# Patient Record
Sex: Female | Born: 1935 | Race: White | Hispanic: No | Marital: Married | Smoking: Never smoker
Health system: Southern US, Community
[De-identification: ages and names within clinical notes are randomized; demographics above are authoritative.]

## PROBLEM LIST (undated history)

## (undated) DIAGNOSIS — N952 Postmenopausal atrophic vaginitis: Secondary | ICD-10-CM

## (undated) DIAGNOSIS — Z972 Presence of dental prosthetic device (complete) (partial): Secondary | ICD-10-CM

## (undated) DIAGNOSIS — M810 Age-related osteoporosis without current pathological fracture: Secondary | ICD-10-CM

## (undated) DIAGNOSIS — N889 Noninflammatory disorder of cervix uteri, unspecified: Secondary | ICD-10-CM

## (undated) DIAGNOSIS — I499 Cardiac arrhythmia, unspecified: Secondary | ICD-10-CM

## (undated) DIAGNOSIS — E039 Hypothyroidism, unspecified: Secondary | ICD-10-CM

## (undated) HISTORY — DX: Cardiac arrhythmia, unspecified: I49.9

## (undated) HISTORY — PX: ANTERIOR AND POSTERIOR VAGINAL REPAIR: SUR5

## (undated) HISTORY — DX: Hypothyroidism, unspecified: E03.9

## (undated) HISTORY — PX: TONSILLECTOMY: SUR1361

## (undated) HISTORY — PX: TUBAL LIGATION: SHX77

## (undated) HISTORY — DX: Postmenopausal atrophic vaginitis: N95.2

## (undated) HISTORY — DX: Age-related osteoporosis without current pathological fracture: M81.0

## (undated) HISTORY — PX: ENTEROCELE REPAIR: SHX623

## (undated) HISTORY — PX: ABDOMINAL HYSTERECTOMY: SHX81

## (undated) HISTORY — DX: Noninflammatory disorder of cervix uteri, unspecified: N88.9

---

## 2004-11-08 ENCOUNTER — Ambulatory Visit: Payer: Self-pay | Admitting: Internal Medicine

## 2005-11-14 ENCOUNTER — Ambulatory Visit: Payer: Self-pay | Admitting: Internal Medicine

## 2006-11-30 ENCOUNTER — Ambulatory Visit: Payer: Self-pay | Admitting: Internal Medicine

## 2007-12-19 ENCOUNTER — Ambulatory Visit: Payer: Self-pay | Admitting: Internal Medicine

## 2008-12-24 ENCOUNTER — Ambulatory Visit: Payer: Self-pay | Admitting: Internal Medicine

## 2008-12-24 ENCOUNTER — Ambulatory Visit: Payer: Self-pay | Admitting: Unknown Physician Specialty

## 2009-01-01 ENCOUNTER — Inpatient Hospital Stay: Payer: Self-pay | Admitting: Unknown Physician Specialty

## 2009-08-03 ENCOUNTER — Ambulatory Visit: Payer: Self-pay | Admitting: Unknown Physician Specialty

## 2009-12-28 ENCOUNTER — Ambulatory Visit: Payer: Self-pay | Admitting: Internal Medicine

## 2010-01-01 ENCOUNTER — Ambulatory Visit: Payer: Self-pay | Admitting: Internal Medicine

## 2010-07-05 ENCOUNTER — Ambulatory Visit: Payer: Self-pay | Admitting: Internal Medicine

## 2011-01-10 ENCOUNTER — Ambulatory Visit: Payer: Self-pay

## 2012-01-30 ENCOUNTER — Ambulatory Visit: Payer: Self-pay

## 2013-03-20 ENCOUNTER — Ambulatory Visit: Payer: Self-pay

## 2014-03-24 ENCOUNTER — Ambulatory Visit: Payer: Self-pay

## 2014-12-11 ENCOUNTER — Ambulatory Visit: Payer: Self-pay | Admitting: Obstetrics and Gynecology

## 2014-12-11 LAB — BASIC METABOLIC PANEL
Anion Gap: 5 — ABNORMAL LOW (ref 7–16)
BUN: 11 mg/dL (ref 7–18)
CALCIUM: 9.6 mg/dL (ref 8.5–10.1)
CHLORIDE: 105 mmol/L (ref 98–107)
Co2: 32 mmol/L (ref 21–32)
Creatinine: 0.63 mg/dL (ref 0.60–1.30)
EGFR (African American): >60
EGFR (Non-African Amer.): >60
Glucose: 99 mg/dL (ref 65–99)
OSMOLALITY: 283 (ref 275–301)
Potassium: 4.3 mmol/L (ref 3.5–5.1)
Sodium: 142 mmol/L (ref 136–145)

## 2014-12-11 LAB — CBC
HCT: 39.6 % (ref 35.0–47.0)
HGB: 12.8 g/dL (ref 12.0–16.0)
MCH: 30.3 pg (ref 26.0–34.0)
MCHC: 32.4 g/dL (ref 32.0–36.0)
MCV: 94 fL (ref 80–100)
Platelet: 332 10*3/uL (ref 150–440)
RBC: 4.24 10*6/uL (ref 3.80–5.20)
RDW: 13.2 % (ref 11.5–14.5)
WBC: 7.5 10*3/uL (ref 3.6–11.0)

## 2014-12-15 ENCOUNTER — Ambulatory Visit: Payer: Self-pay | Admitting: Obstetrics and Gynecology

## 2014-12-16 LAB — CREATININE, SERUM
Creatinine: 0.68 mg/dL (ref 0.60–1.30)
EGFR (African American): >60

## 2014-12-16 LAB — HEMOGLOBIN: HGB: 11.4 g/dL — ABNORMAL LOW (ref 12.0–16.0)

## 2015-04-05 NOTE — Op Note (Signed)
PATIENT NAME:  Tiffany Bruce, Tiffany Bruce MR#:  [id number] DATE OF BIRTH:  1934/03/20  DATE OF PROCEDURE:  12/15/2014  PREOPERATIVE DIAGNOSIS: Procidentia.   POSTOPERATIVE DIAGNOSES: 1.  Vaginal enterocele.  2.  Third degree cystocele.  3.  High rectocele.   OPERATIVE PROCEDURES:  1.  Anterior and posterior colporrhaphy with enterocele ligation.  2.  Cystoscopy.   SURGEON: Prentice DockerMartin A. DeFrancesco, MD  FIRST ASSISTANT: Anika S. Valentino Saxonherry, MD  SECOND ASSISTANT: Mare LoanJan Beard, PA-S   ANESTHESIA: General endotracheal.   INDICATIONS: The patient is a 81 year old menopausal white female, with a known history of anterior and posterior colporrhaphy in the past with suspected persistence of uterus, presents now for surgical management of vaginal vault prolapse. Preoperative evaluation was suspicious for procidentia.   FINDINGS AT SURGERY: The apex of the vagina was dimpled with a macerated appearance of the cervix. No evidence of uterus, tubes or ovaries within the pelvic cavity. A large enterocele sac was excised. A high rectocele was noted. A third degree cystocele was noted.   DESCRIPTION OF PROCEDURE: The patient was brought to the Operating Room where she was placed in a supine position. General endotracheal anesthesia was induced without difficulty. She was placed in the dorsal lithotomy position, using the candy-cane stirrups. A Betadine perineal intravaginal prep and drape was performed in a standard fashion. A Foley catheter was placed into the bladder and was draining clear yellow urine. The cervix (suspected cervix) was grasped with a double-tooth tenaculum. The uterus appeared to be senescent. The posterior cuff was grasped and incised with Mayo scissors. With dissection of the rectovaginal space, there appeared to be no evidence of uterus present. Eventually the cul-de-sac was entered. Sterile milk was placed into the bladder to verify that the bladder was intact without evidence of perforation. The bladder  was noted to be intact. This sterile milk was then removed from the bladder.   The enterocele was isolated through sharp and blunt dissection. The anterior vagina was opened in the standard fashion for a cystocele repair. Sarina SerAllis Adair retractors were used to facilitate exposure. The vaginal mucosa was dissected off of the vesicovaginal fascia. The cystocele was mobilized. The cystocele was reduced using 2-0 Vicryl sutures in a vertical mattress technique. Part of the bladder muscularis was also reapproximated using several 3-0 Vicryl sutures. The cystocele was nicely reduced. The excess vaginal mucosa was trimmed. The anterior vagina was closed using simple interrupted technique of 2-0 Vicryl suture. The posterior defect was closed using a pursestring stitch of 2-0 Vicryl. The excess posterior vaginal mucosa was excised. The remaining vaginal mucosa was reapproximated using 2-0 Vicryl sutures.   Following completion of the anterior and posterior colporrhaphy with enterocele ligation, cystoscopy was performed. Fluorescein 10% solution, 1 mL was given intravenously. The 30-degree scope was used verify that there was adequate efflux from the ureteral orifices. Jets of urine were demonstrated bilaterally. The dome of the bladder was intact. Following cystoscopy, the Foley catheter was replaced. The vagina was packed with Kerlix gauze with Premarin cream.   The patient was then awakened, extubated, and taken to the recovery room in satisfactory condition.   Estimated blood  loss was 50 mL. All instruments, needles and sponge counts were verified as correct. The patient is to receive Ancef antibiotic prophylaxis.    ____________________________ Prentice DockerMartin A. DeFrancesco, MD mad:MT D: 12/15/2014 16:14:41 ET T: 12/15/2014 16:37:10 ET JOB#: [id number]  cc: Daphine DeutscherMartin A. DeFrancesco, MD, <Dictator> [facility] Prentice DockerMARTIN A DEFRANCESCO MD ELECTRONICALLY SIGNED 12/18/2014 13:02

## 2015-07-28 ENCOUNTER — Ambulatory Visit (INDEPENDENT_AMBULATORY_CARE_PROVIDER_SITE_OTHER): Payer: PRIVATE HEALTH INSURANCE | Admitting: Obstetrics and Gynecology

## 2015-07-28 ENCOUNTER — Encounter: Payer: Self-pay | Admitting: Obstetrics and Gynecology

## 2015-07-28 VITALS — BP 146/69 | HR 82 | Ht 62.0 in | Wt 112.1 lb

## 2015-07-28 DIAGNOSIS — N8110 Cystocele, unspecified: Secondary | ICD-10-CM

## 2015-07-28 DIAGNOSIS — Z780 Asymptomatic menopausal state: Secondary | ICD-10-CM | POA: Diagnosis not present

## 2015-07-28 DIAGNOSIS — N8111 Cystocele, midline: Secondary | ICD-10-CM | POA: Insufficient documentation

## 2015-07-28 DIAGNOSIS — N952 Postmenopausal atrophic vaginitis: Secondary | ICD-10-CM

## 2015-07-28 DIAGNOSIS — IMO0002 Reserved for concepts with insufficient information to code with codable children: Secondary | ICD-10-CM

## 2015-07-28 NOTE — Progress Notes (Signed)
Patient ID: Tiffany Bruce, female   DOB: [birth year], 81 y.o.   MRN: [id number] 6 month f/u ant/post repair w/enterocele ligaton Estrace cream once a week  Chief complaint: 1.  Six-month follow-up. 2.  Status post anterior/posterior colporrhaphy with enterocele ligation.  The patient presents today for follow-up 6 months after surgery for pelvic organ prolapse.  She reports normal bowel and bladder function.  She is not experiencing any pelvic pressure or discomfort.  The patient reports no vaginal discharge or bleeding.  She is using Estrace cream intravaginally once a week.  Past Medical History  Diagnosis Date  . Hypothyroidism   . Osteoporosis   . Abnormal heart rhythm   . Vaginal atrophy   . Vaginal atrophy   . Cervical lesion    Past Surgical History  Procedure Laterality Date  . Anterior and posterior vaginal repair  12/15/2014,2010  . Enterocele repair    . Tubal ligation    . Tonsillectomy       OBJECTIVE: BP 146/69 mmHg  Pulse 82  Ht  (1.575 m)  Wt 112 lb 1.6 oz (50.848 kg)  BMI 20.50 kg/m2 Patient is an elderly, pleasant female in no acute distress.  She is alert and oriented. Abdomen: Soft, nontender, without organomegaly. Pelvic exam: External genitalia-mild atrophic changes. BUS-small urethral caruncle, otherwise normal. Vagina-first-degree cystocele; no enterocele; no rectocele. Cervix-surgically absent Uterus-surgically absent Adnexa-nonpalpable, nontender. Rectovaginal-normal.  External exam; normal sphincter tone,; no rectal masses.  IMPRESSION: 1.  Normal 6 month interval follow-up after pelvic organ prolapse surgery. 2.  Status post anterior/posterior colporrhaphy with enterocele ligation.  PLAN: 1.  Continue with Estrace cream 1 g intravaginally weekly. 2.  Recommend no chronic heavy lifting. 3.  Return in 1 year for follow-up.  A total of 15 minutes were spent face-to-face with the patient during this encounter and over half of that time  dealt with counseling and coordination of care.   Herold Harms, MD

## 2015-07-28 NOTE — Patient Instructions (Signed)
1.  Continue with Estrace cream 1 g intravaginally weekly. 2.  Avoid chronic heavy lifting. 3.  Return in 1 year for follow-up.

## 2016-02-11 ENCOUNTER — Telehealth: Payer: Self-pay | Admitting: Obstetrics and Gynecology

## 2016-02-11 MED ORDER — ESTROGENS, CONJUGATED 0.625 MG/GM VA CREA: 1.0000 | TOPICAL_CREAM | Freq: Every day | VAGINAL | Status: DC

## 2016-02-11 NOTE — Telephone Encounter (Signed)
Pt aware med erx. 

## 2016-02-11 NOTE — Telephone Encounter (Signed)
Pt was given Premarin samples and would like rx sent to [facility] for a 90 day supply for ins purposes. For 90 day she pays $90.00 30 day is $45.00 that's why she wants a 90 days supply.

## 2016-02-19 DIAGNOSIS — H2513 Age-related nuclear cataract, bilateral: Secondary | ICD-10-CM | POA: Diagnosis not present

## 2016-03-11 DIAGNOSIS — E039 Hypothyroidism, unspecified: Secondary | ICD-10-CM | POA: Diagnosis not present

## 2016-03-11 DIAGNOSIS — I10 Essential (primary) hypertension: Secondary | ICD-10-CM | POA: Diagnosis not present

## 2016-03-11 DIAGNOSIS — E782 Mixed hyperlipidemia: Secondary | ICD-10-CM | POA: Diagnosis not present

## 2016-03-11 DIAGNOSIS — Z0001 Encounter for general adult medical examination with abnormal findings: Secondary | ICD-10-CM | POA: Diagnosis not present

## 2016-03-11 DIAGNOSIS — M545 Low back pain: Secondary | ICD-10-CM | POA: Diagnosis not present

## 2016-03-21 ENCOUNTER — Other Ambulatory Visit: Payer: Self-pay | Admitting: Nurse Practitioner

## 2016-03-21 DIAGNOSIS — Z0001 Encounter for general adult medical examination with abnormal findings: Secondary | ICD-10-CM | POA: Diagnosis not present

## 2016-03-21 DIAGNOSIS — E559 Vitamin D deficiency, unspecified: Secondary | ICD-10-CM | POA: Diagnosis not present

## 2016-03-21 DIAGNOSIS — I10 Essential (primary) hypertension: Secondary | ICD-10-CM | POA: Diagnosis not present

## 2016-03-21 DIAGNOSIS — E2839 Other primary ovarian failure: Secondary | ICD-10-CM

## 2016-03-21 DIAGNOSIS — Z1231 Encounter for screening mammogram for malignant neoplasm of breast: Secondary | ICD-10-CM

## 2016-03-21 DIAGNOSIS — E782 Mixed hyperlipidemia: Secondary | ICD-10-CM | POA: Diagnosis not present

## 2016-04-04 ENCOUNTER — Ambulatory Visit
Admission: RE | Admit: 2016-04-04 | Discharge: 2016-04-04 | Disposition: A | Discharge status: Home / Self Care | Payer: PPO | Source: Ambulatory Visit | Attending: Nurse Practitioner | Admitting: Nurse Practitioner

## 2016-04-04 ENCOUNTER — Ambulatory Visit: Payer: Self-pay

## 2016-04-04 DIAGNOSIS — Z13820 Encounter for screening for osteoporosis: Secondary | ICD-10-CM | POA: Diagnosis not present

## 2016-04-04 DIAGNOSIS — M8580 Other specified disorders of bone density and structure, unspecified site: Secondary | ICD-10-CM | POA: Diagnosis not present

## 2016-04-04 DIAGNOSIS — Z1231 Encounter for screening mammogram for malignant neoplasm of breast: Secondary | ICD-10-CM

## 2016-04-04 DIAGNOSIS — E2839 Other primary ovarian failure: Secondary | ICD-10-CM

## 2016-06-27 DIAGNOSIS — R21 Rash and other nonspecific skin eruption: Secondary | ICD-10-CM | POA: Diagnosis not present

## 2016-06-27 DIAGNOSIS — L237 Allergic contact dermatitis due to plants, except food: Secondary | ICD-10-CM | POA: Diagnosis not present

## 2016-07-19 ENCOUNTER — Telehealth: Payer: Self-pay | Admitting: Obstetrics and Gynecology

## 2016-07-19 NOTE — Telephone Encounter (Signed)
Patient called requesting a call back regarding her upcoming appointment. Thanks

## 2016-07-20 ENCOUNTER — Telehealth: Payer: Self-pay | Admitting: Obstetrics and Gynecology

## 2016-07-20 NOTE — Telephone Encounter (Signed)
Patient would like a call back on her cell to discuss something with you regarding her upcoming appointment. Thanks

## 2016-07-21 NOTE — Telephone Encounter (Signed)
FYI_-------Pt states IC is uncomfortable. Feels like something is stretching. Doesn't want to have sex anymore but husband wants to. Has an appt soon. Would like you to tell her husband at appt she shouldn't have sex anymore. She states she is [id number] and its time to give sex up!

## 2016-07-21 NOTE — Telephone Encounter (Signed)
See previous telephone message  

## 2016-08-02 ENCOUNTER — Ambulatory Visit (INDEPENDENT_AMBULATORY_CARE_PROVIDER_SITE_OTHER): Payer: PRIVATE HEALTH INSURANCE | Admitting: Obstetrics and Gynecology

## 2016-08-02 VITALS — BP 193/74 | HR 73 | Ht 62.0 in | Wt 113.9 lb

## 2016-08-02 DIAGNOSIS — N8110 Cystocele, unspecified: Secondary | ICD-10-CM

## 2016-08-02 DIAGNOSIS — Z780 Asymptomatic menopausal state: Secondary | ICD-10-CM | POA: Diagnosis not present

## 2016-08-02 DIAGNOSIS — N941 Unspecified dyspareunia: Secondary | ICD-10-CM

## 2016-08-02 DIAGNOSIS — IMO0002 Reserved for concepts with insufficient information to code with codable children: Secondary | ICD-10-CM

## 2016-08-02 DIAGNOSIS — N952 Postmenopausal atrophic vaginitis: Secondary | ICD-10-CM | POA: Diagnosis not present

## 2016-08-02 NOTE — Progress Notes (Signed)
Chief complaint: 1. Cystocele 2. Dyspareunia  Patient presents for 1 year follow-up on pelvic organ prolapse. She is status post anterior/posterior colporrhaphy with enterocele ligation. She has been having some increased discomfort with intercourse. She notes that her bladder appears to be dropping further since the surgical procedure. Husband has Alzheimer's dementia and is difficult to relate to regarding sexual relations. Patient tends decided to stop having intercourse due to discomfort and partners health condition  Past medical history, past surgical history, problem list, medications, and allergies are reviewed  OBJECTIVE: BP (!) 193/74   Pulse 73   Ht 5\' 2"  (1.575 m)   Wt 113 lb 14.4 oz (51.7 kg)   BMI 20.83 kg/m  Patient is an elderly, pleasant female in no acute distress.  She is alert and oriented. Abdomen: Soft, nontender, without organomegaly. Pelvic exam: External genitalia-mild atrophic changes. BUS-small urethral caruncle, otherwise normal. Vagina-second cystocele; no enterocele; no rectocele. Cervix-surgically absent Uterus-surgically absent Adnexa-nonpalpable, nontender. Rectovaginal-normal.  External exam; normal sphincter tone,; no rectal masses.  ASSESSMENT: 1. 18 months status post pelvic organ prolapse surgery 2. Status post anterior/posterior colporrhaphy with enterocele ligation 3. Second-degree cystocele (slight progression), asymptomatic 4. Dyspareunia  PLAN: 1. Continue with Estrace cream 1 g intravaginally weekly 2. Continue with no chronic heavy lifting 3. Return in 1 year for follow-up 4. Agree with the patient's decision to discontinue intercourse at this time.  A total of 15 minutes were spent face-to-face with the patient during this encounter and over half of that time dealt with counseling and coordination of care.  Herold HarmsMartin A Dinero Chavira, MD  Note: This dictation was prepared with Dragon dictation along with smaller phrase technology. Any  transcriptional errors that result from this process are unintentional.

## 2016-08-02 NOTE — Patient Instructions (Signed)
1. Continue using Estrace cream intravaginal once a week 2. Return in 1 year for follow-up 3. Recommend discontinuing sexual intercourse due to adverse effects

## 2016-09-19 DIAGNOSIS — E039 Hypothyroidism, unspecified: Secondary | ICD-10-CM | POA: Diagnosis not present

## 2016-09-19 DIAGNOSIS — N8110 Cystocele, unspecified: Secondary | ICD-10-CM | POA: Diagnosis not present

## 2017-03-10 DIAGNOSIS — H2513 Age-related nuclear cataract, bilateral: Secondary | ICD-10-CM | POA: Diagnosis not present

## 2017-03-27 ENCOUNTER — Other Ambulatory Visit: Payer: Self-pay | Admitting: Nurse Practitioner

## 2017-03-27 DIAGNOSIS — Z1231 Encounter for screening mammogram for malignant neoplasm of breast: Secondary | ICD-10-CM

## 2017-03-27 DIAGNOSIS — Z0001 Encounter for general adult medical examination with abnormal findings: Secondary | ICD-10-CM | POA: Diagnosis not present

## 2017-03-27 DIAGNOSIS — E039 Hypothyroidism, unspecified: Secondary | ICD-10-CM | POA: Diagnosis not present

## 2017-03-27 DIAGNOSIS — E782 Mixed hyperlipidemia: Secondary | ICD-10-CM | POA: Diagnosis not present

## 2017-03-31 DIAGNOSIS — E559 Vitamin D deficiency, unspecified: Secondary | ICD-10-CM | POA: Diagnosis not present

## 2017-03-31 DIAGNOSIS — E039 Hypothyroidism, unspecified: Secondary | ICD-10-CM | POA: Diagnosis not present

## 2017-03-31 DIAGNOSIS — Z0001 Encounter for general adult medical examination with abnormal findings: Secondary | ICD-10-CM | POA: Diagnosis not present

## 2017-04-17 ENCOUNTER — Ambulatory Visit
Admission: RE | Admit: 2017-04-17 | Discharge: 2017-04-17 | Disposition: A | Discharge status: Home / Self Care | Payer: PPO | Source: Ambulatory Visit | Attending: Nurse Practitioner | Admitting: Nurse Practitioner

## 2017-04-17 DIAGNOSIS — Z1231 Encounter for screening mammogram for malignant neoplasm of breast: Secondary | ICD-10-CM | POA: Diagnosis not present

## 2017-05-08 DIAGNOSIS — H6123 Impacted cerumen, bilateral: Secondary | ICD-10-CM | POA: Diagnosis not present

## 2017-05-08 DIAGNOSIS — J301 Allergic rhinitis due to pollen: Secondary | ICD-10-CM | POA: Diagnosis not present

## 2017-07-31 DIAGNOSIS — H2513 Age-related nuclear cataract, bilateral: Secondary | ICD-10-CM | POA: Diagnosis not present

## 2017-07-31 DIAGNOSIS — H01003 Unspecified blepharitis right eye, unspecified eyelid: Secondary | ICD-10-CM | POA: Diagnosis not present

## 2017-07-31 DIAGNOSIS — H04123 Dry eye syndrome of bilateral lacrimal glands: Secondary | ICD-10-CM | POA: Diagnosis not present

## 2017-08-01 ENCOUNTER — Ambulatory Visit (INDEPENDENT_AMBULATORY_CARE_PROVIDER_SITE_OTHER): Payer: PPO | Admitting: Obstetrics and Gynecology

## 2017-08-01 ENCOUNTER — Encounter: Payer: Self-pay | Admitting: Obstetrics and Gynecology

## 2017-08-01 VITALS — BP 160/63 | HR 82 | Ht 62.0 in | Wt 118.1 lb

## 2017-08-01 DIAGNOSIS — N941 Unspecified dyspareunia: Secondary | ICD-10-CM

## 2017-08-01 DIAGNOSIS — N952 Postmenopausal atrophic vaginitis: Secondary | ICD-10-CM | POA: Diagnosis not present

## 2017-08-01 NOTE — Progress Notes (Signed)
Dragon/transcription Corrupted (note documentation limited). BILLING IS BASED ON TIME  Chief complaint: 1. Cystocele 2. Dyspareunia  Patient presents for 1 year follow-up on menopausal symptoms of vaginal atrophy with dyspareunia. She also is status post pelvic organ prolapse surgery and has a recurrence of a third-degree cystocele. Bowel and bladder function Are normal. She is experiencing intercourse approximately once a week.  Past medical history, past surgical history, problem list, medications, and allergies are reviewed.  OBJECTIVE: BP (!) 160/63   Pulse 82   Ht 5\' 2"  (1.575 m)   Wt 118 lb 1.6 oz (53.6 kg)   BMI 21.60 kg/m  Patient is an elderly, pleasant female in no acute distress. She is alert and oriented. Abdomen: Soft, nontender, without organomegaly. Pelvic exam: (Unchanged) External genitalia-mild atrophic changes. BUS-small urethral caruncle, otherwise normal. Vagina-second cystocele; no enterocele; no rectocele. Cervix-surgically absent Uterus-surgically absent Adnexa-nonpalpable, nontender. Rectovaginal-normal. External exam; normal sphincter tone,; no rectal masses.  ASSESSMENT: 1.  Second-degree cystocele, stable. 2.  History of anterior, posterior colporrhaphy with enterocele ligation 2.5 years ago. 3.  Dyspareunia, tolerated 4.  Vaginal atrophy, stable.  PLAN: 1.  Continue with Estrace cream 1 g intravaginally weekly. 2.  Return in 1 year for follow-up.  A total of 15 minutes were spent face-to-face with the patient during this encounter and over half of that time dealt with counseling and coordination of care.   Herold Harms, MD

## 2017-08-01 NOTE — Patient Instructions (Signed)
1. Return in 1 year for follow-up 2. Continue with Estrace cream 1/2 g intravaginal once a week

## 2017-08-30 DIAGNOSIS — H04123 Dry eye syndrome of bilateral lacrimal glands: Secondary | ICD-10-CM | POA: Diagnosis not present

## 2017-08-30 DIAGNOSIS — H01003 Unspecified blepharitis right eye, unspecified eyelid: Secondary | ICD-10-CM | POA: Diagnosis not present

## 2017-09-25 DIAGNOSIS — E782 Mixed hyperlipidemia: Secondary | ICD-10-CM | POA: Diagnosis not present

## 2017-09-25 DIAGNOSIS — E039 Hypothyroidism, unspecified: Secondary | ICD-10-CM | POA: Diagnosis not present

## 2018-03-16 ENCOUNTER — Other Ambulatory Visit: Payer: Self-pay | Admitting: Nurse Practitioner

## 2018-03-16 DIAGNOSIS — Z1231 Encounter for screening mammogram for malignant neoplasm of breast: Secondary | ICD-10-CM

## 2018-04-06 ENCOUNTER — Other Ambulatory Visit: Payer: Self-pay | Admitting: Internal Medicine

## 2018-04-16 ENCOUNTER — Encounter: Payer: Self-pay | Admitting: Nurse Practitioner

## 2018-04-16 ENCOUNTER — Ambulatory Visit (INDEPENDENT_AMBULATORY_CARE_PROVIDER_SITE_OTHER): Payer: PPO | Admitting: Nurse Practitioner

## 2018-04-16 VITALS — BP 130/68 | HR 63 | Resp 16 | Ht 62.0 in | Wt 111.6 lb

## 2018-04-16 DIAGNOSIS — R300 Dysuria: Secondary | ICD-10-CM | POA: Diagnosis not present

## 2018-04-16 DIAGNOSIS — E039 Hypothyroidism, unspecified: Secondary | ICD-10-CM

## 2018-04-16 DIAGNOSIS — Z0001 Encounter for general adult medical examination with abnormal findings: Secondary | ICD-10-CM | POA: Diagnosis not present

## 2018-04-16 DIAGNOSIS — E559 Vitamin D deficiency, unspecified: Secondary | ICD-10-CM

## 2018-04-16 DIAGNOSIS — Z23 Encounter for immunization: Secondary | ICD-10-CM | POA: Insufficient documentation

## 2018-04-16 MED ORDER — LEVOTHYROXINE SODIUM 75 MCG PO TABS: 75.0000 ug | ORAL_TABLET | Freq: Every day | ORAL | 4 refills | Status: DC

## 2018-04-16 NOTE — Progress Notes (Signed)
[facility] [address] [place], [place] [address]  Internal MEDICINE  Office Visit Note  Patient Name: Tiffany Bruce  [id number]  [id number]  Date of Service: 04/16/2018   Pt is here for routine health maintenance  Chief Complaint  Patient presents with  . Hypothyroidism     The patient is due to have routine, fasting labs drawn. She is scheduled for mammogram on Wednesday, 04/18/2018. She has no physical concerns or complaints today.    examination  Current Medication: Outpatient Encounter Medications as of 04/16/2018  Medication Sig Note  . levothyroxine (SYNTHROID, LEVOTHROID) 75 MCG tablet Take 1 tablet (75 mcg total) by mouth daily before breakfast.   . [DISCONTINUED] levothyroxine (SYNTHROID, LEVOTHROID) 75 MCG tablet TAKE 1 TABLET BY MOUTH EVERY DAY ON AN EMPTY STOMACH FOR HYPOTHYROID   . Ascorbic Acid (VITAMIN C) 100 MG tablet Take 100 mg by mouth daily.   . calcium-vitamin D (OSCAL WITH D) 250-125 MG-UNIT per tablet Take 1 tablet by mouth daily.   Marland Kitchen conjugated estrogens (PREMARIN) vaginal cream Place 1 Applicatorful vaginally daily. 1 gram weekly   . docusate sodium (COLACE) 100 MG capsule Take 100 mg by mouth 2 (two) times daily.   . vitamin A 29562 UNIT capsule Take 10,000 Units by mouth daily.   . vitamin B-12 (CYANOCOBALAMIN) 100 MCG tablet Take 100 mcg by mouth daily.   . vitamin E 100 UNIT capsule Take by mouth daily.   . [DISCONTINUED] levothyroxine (SYNTHROID, LEVOTHROID) 50 MCG tablet TK 1 T PO QD ON EMPTY STOMACH FOR HYPOTHYROID 07/28/2015: Received from: External Pharmacy   No facility-administered encounter medications on file as of 04/16/2018.     Surgical History: Past Surgical History:  Procedure Laterality Date  . ABDOMINAL HYSTERECTOMY    . ANTERIOR AND POSTERIOR VAGINAL REPAIR  12/15/2014,2010  . ENTEROCELE REPAIR    . TONSILLECTOMY    . TUBAL LIGATION      Medical History: Past Medical History:  Diagnosis Date  . Abnormal  heart rhythm   . Cervical lesion   . Hypothyroidism   . Osteoporosis   . Vaginal atrophy   . Vaginal atrophy     Family History: Family History  Problem Relation Age of Onset  . Cancer Neg Hx   . Diabetes Neg Hx   . Heart disease Neg Hx       Review of Systems  Constitutional: Negative for activity change, chills, fatigue and unexpected weight change.  HENT: Positive for postnasal drip. Negative for congestion, rhinorrhea, sneezing and sore throat.   Eyes: Negative.  Negative for redness.  Respiratory: Negative for cough, chest tightness and shortness of breath.   Cardiovascular: Negative for chest pain and palpitations.  Gastrointestinal: Negative for abdominal pain, constipation, diarrhea, nausea and vomiting.  Endocrine:       Well controlled thyroid disease.   Genitourinary: Negative for dysuria and frequency.  Musculoskeletal: Negative for arthralgias, back pain, joint swelling and neck pain.  Skin: Negative for rash.  Neurological: Negative for dizziness, tremors, numbness and headaches.  Hematological: Negative for adenopathy. Does not bruise/bleed easily.  Psychiatric/Behavioral: Negative for behavioral problems (Depression), sleep disturbance and suicidal ideas. The patient is not nervous/anxious.      Vital Signs: BP 130/68   Pulse 63   Resp 16   Ht  (1.575 m)   Wt 111 lb 9.6 oz (50.6 kg)   SpO2 98%   BMI 20.41 kg/m    Physical Exam  Constitutional: She is oriented  to person, place, and time. She appears well-developed and well-nourished. No distress.  HENT:  Head: Normocephalic and atraumatic.  Nose: Nose normal.  Mouth/Throat: Oropharynx is clear and moist. No oropharyngeal exudate.  Eyes: Pupils are equal, round, and reactive to light. EOM are normal.  Neck: Normal range of motion. Neck supple. No JVD present. Carotid bruit is not present. No tracheal deviation present. No thyromegaly present.  Cardiovascular: Normal rate, regular rhythm,  normal heart sounds and intact distal pulses. Exam reveals no gallop and no friction rub.  No murmur heard. Pulmonary/Chest: Effort normal and breath sounds normal. No respiratory distress. She has no wheezes. She has no rales. She exhibits no tenderness. Right breast exhibits no inverted nipple, no mass, no nipple discharge, no skin change and no tenderness. Left breast exhibits no inverted nipple, no mass, no nipple discharge, no skin change and no tenderness.  Abdominal: Soft. Bowel sounds are normal. There is no tenderness.  Musculoskeletal: Normal range of motion.  Lymphadenopathy:    She has no cervical adenopathy.  Neurological: She is alert and oriented to person, place, and time. She displays normal reflexes. No cranial nerve deficit.  Skin: Skin is warm and dry. She is not diaphoretic.  Psychiatric: She has a normal mood and affect. Her behavior is normal. Judgment and thought content normal.  Nursing note and vitals reviewed.   Assessment/Plan: 1. Encounter for general adult medical examination with abnormal findings Annual health maintenance exam. Routine, fasting labs ordered.  - CBC with Differential/Platelet - Comprehensive metabolic panel  2. Acquired hypothyroidism Check thyroid panel and adjust levothyroxine as indicated.  - TSH - T4, free - Lipid panel - levothyroxine (SYNTHROID, LEVOTHROID) 75 MCG tablet; Take 1 tablet (75 mcg total) by mouth daily before breakfast.  Dispense: 90 tablet; Refill: 4  3. Vitamin D deficiency - Vitamin D 1,25 dihydroxy  4. Dysuria - Urinalysis, Routine w reflex microscopic  5. Need for vaccination against Streptococcus pneumoniae using pneumococcal conjugate vaccine 13 - Pneumococcal conjugate vaccine 13-valent IM   General Counseling: Keyanah verbalizes understanding of the findings of todays visit and agrees with plan of treatment. I have discussed any further diagnostic evaluation that may be needed or ordered today. We also  reviewed her medications today. she has been encouraged to call the office with any questions or concerns that should arise related to todays visit.  This patient was seen by Vincent Gros, FNP- C in Collaboration with Dr Lyndon Code as a part of collaborative care agreement    Orders Placed This Encounter  Procedures  . Pneumococcal conjugate vaccine 13-valent IM  . Urinalysis, Routine w reflex microscopic  . CBC with Differential/Platelet  . Comprehensive metabolic panel  . T4, free  . TSH  . Lipid panel  . Vitamin D 1,25 dihydroxy    Meds ordered this encounter  Medications  . levothyroxine (SYNTHROID, LEVOTHROID) 75 MCG tablet    Sig: Take 1 tablet (75 mcg total) by mouth daily before breakfast.    Dispense:  90 tablet    Refill:  4    Order Specific Question:   Supervising Provider    Answer:   Lyndon Code [[id number]]    Time spent: 69 Minutes      Lyndon Code, MD  Internal Medicine

## 2018-04-17 LAB — URINALYSIS, ROUTINE W REFLEX MICROSCOPIC
Bilirubin, UA: NEGATIVE
GLUCOSE, UA: NEGATIVE
KETONES UA: NEGATIVE
Nitrite, UA: NEGATIVE
Protein, UA: NEGATIVE
RBC UA: NEGATIVE
SPEC GRAV UA: 1.005 (ref 1.005–1.030)
UUROB: 0.2 mg/dL (ref 0.2–1.0)
pH, UA: 5 (ref 5.0–7.5)

## 2018-04-17 LAB — MICROSCOPIC EXAMINATION
Bacteria, UA: NONE SEEN
CASTS: NONE SEEN /LPF
EPITHELIAL CELLS (NON RENAL): NONE SEEN /HPF (ref 0–10)
RBC MICROSCOPIC, UA: NONE SEEN /HPF (ref 0–2)

## 2018-04-18 ENCOUNTER — Ambulatory Visit
Admission: RE | Admit: 2018-04-18 | Discharge: 2018-04-18 | Disposition: A | Discharge status: Home / Self Care | Payer: PPO | Source: Ambulatory Visit | Attending: Nurse Practitioner | Admitting: Nurse Practitioner

## 2018-04-18 DIAGNOSIS — Z1231 Encounter for screening mammogram for malignant neoplasm of breast: Secondary | ICD-10-CM | POA: Insufficient documentation

## 2018-04-20 DIAGNOSIS — Z23 Encounter for immunization: Secondary | ICD-10-CM | POA: Diagnosis not present

## 2018-04-20 DIAGNOSIS — E559 Vitamin D deficiency, unspecified: Secondary | ICD-10-CM | POA: Diagnosis not present

## 2018-04-20 DIAGNOSIS — Z0001 Encounter for general adult medical examination with abnormal findings: Secondary | ICD-10-CM | POA: Diagnosis not present

## 2018-04-20 DIAGNOSIS — E039 Hypothyroidism, unspecified: Secondary | ICD-10-CM | POA: Diagnosis not present

## 2018-04-25 LAB — COMPREHENSIVE METABOLIC PANEL
ALK PHOS: 85 IU/L (ref 39–117)
ALT: 19 IU/L (ref 0–32)
AST: 24 IU/L (ref 0–40)
Albumin/Globulin Ratio: 2.2 (ref 1.2–2.2)
Albumin: 3.9 g/dL (ref 3.5–4.7)
BUN/Creatinine Ratio: 18 (ref 12–28)
BUN: 11 mg/dL (ref 8–27)
Bilirubin Total: 0.4 mg/dL (ref 0.0–1.2)
CO2: 27 mmol/L (ref 20–29)
CREATININE: 0.62 mg/dL (ref 0.57–1.00)
Calcium: 9.2 mg/dL (ref 8.7–10.3)
Chloride: 106 mmol/L (ref 96–106)
GFR calc Af Amer: 98 mL/min/{1.73_m2} (ref >59)
GFR calc non Af Amer: 85 mL/min/{1.73_m2} (ref >59)
GLUCOSE: 105 mg/dL — AB (ref 65–99)
Globulin, Total: 1.8 g/dL (ref 1.5–4.5)
Potassium: 4.2 mmol/L (ref 3.5–5.2)
Sodium: 145 mmol/L — ABNORMAL HIGH (ref 134–144)
Total Protein: 5.7 g/dL — ABNORMAL LOW (ref 6.0–8.5)

## 2018-04-25 LAB — CBC WITH DIFFERENTIAL/PLATELET
BASOS ABS: 0 10*3/uL (ref 0.0–0.2)
Basos: 1 %
EOS (ABSOLUTE): 0.4 10*3/uL (ref 0.0–0.4)
EOS: 6 %
HEMATOCRIT: 37.6 % (ref 34.0–46.6)
HEMOGLOBIN: 12.7 g/dL (ref 11.1–15.9)
IMMATURE GRANS (ABS): 0 10*3/uL (ref 0.0–0.1)
Immature Granulocytes: 0 %
LYMPHS: 43 %
Lymphocytes Absolute: 2.6 10*3/uL (ref 0.7–3.1)
MCH: 29.7 pg (ref 26.6–33.0)
MCHC: 33.8 g/dL (ref 31.5–35.7)
MCV: 88 fL (ref 79–97)
MONOCYTES: 9 %
Monocytes Absolute: 0.5 10*3/uL (ref 0.1–0.9)
Neutrophils Absolute: 2.4 10*3/uL (ref 1.4–7.0)
Neutrophils: 41 %
Platelets: 303 10*3/uL (ref 150–379)
RBC: 4.28 x10E6/uL (ref 3.77–5.28)
RDW: 13.8 % (ref 12.3–15.4)
WBC: 5.9 10*3/uL (ref 3.4–10.8)

## 2018-04-25 LAB — T4, FREE: FREE T4: 1.35 ng/dL (ref 0.82–1.77)

## 2018-04-25 LAB — LIPID PANEL
CHOL/HDL RATIO: 4.2 ratio (ref 0.0–4.4)
Cholesterol, Total: 222 mg/dL — ABNORMAL HIGH (ref 100–199)
HDL: 53 mg/dL (ref >39)
LDL Calculated: 151 mg/dL — ABNORMAL HIGH (ref 0–99)
Triglycerides: 89 mg/dL (ref 0–149)
VLDL CHOLESTEROL CAL: 18 mg/dL (ref 5–40)

## 2018-04-25 LAB — VITAMIN D 1,25 DIHYDROXY
Vitamin D 1, 25 (OH)2 Total: 42 pg/mL
Vitamin D2 1, 25 (OH)2: <10 pg/mL
Vitamin D3 1, 25 (OH)2: 40 pg/mL

## 2018-04-25 LAB — TSH: TSH: 0.049 u[IU]/mL — ABNORMAL LOW (ref 0.450–4.500)

## 2018-06-07 IMAGING — MG MM DIGITAL SCREENING BILAT W/ TOMO W/ CAD
8 series · 9 of 24 positions shown · non-contrast
Comparison: Previous exam(s).

CLINICAL DATA: Screening.

EXAM:
DIGITAL SCREENING BILATERAL MAMMOGRAM WITH TOMO AND CAD

[R MLO synth-2D]
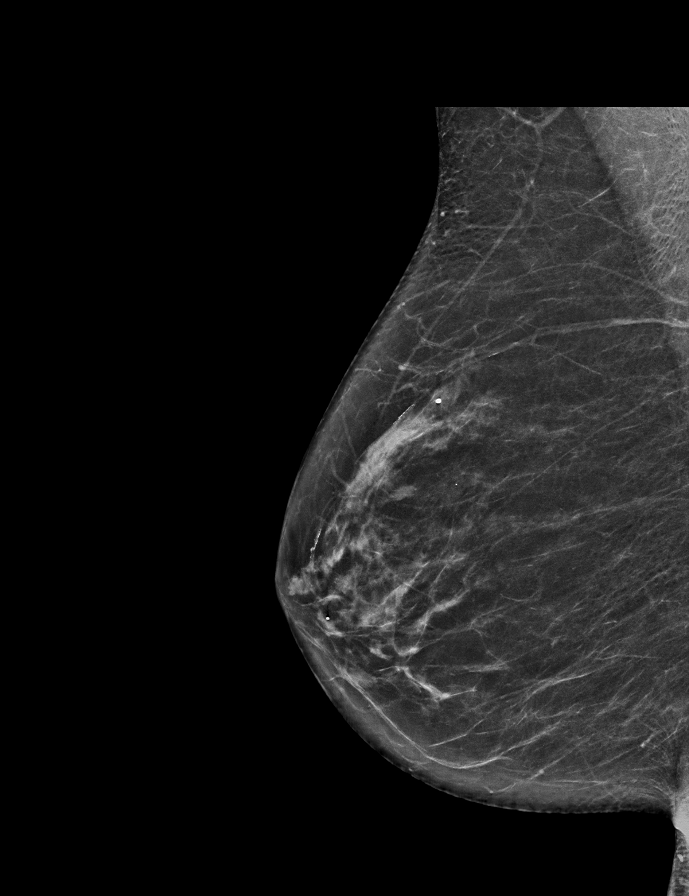

[L CC synth-2D]
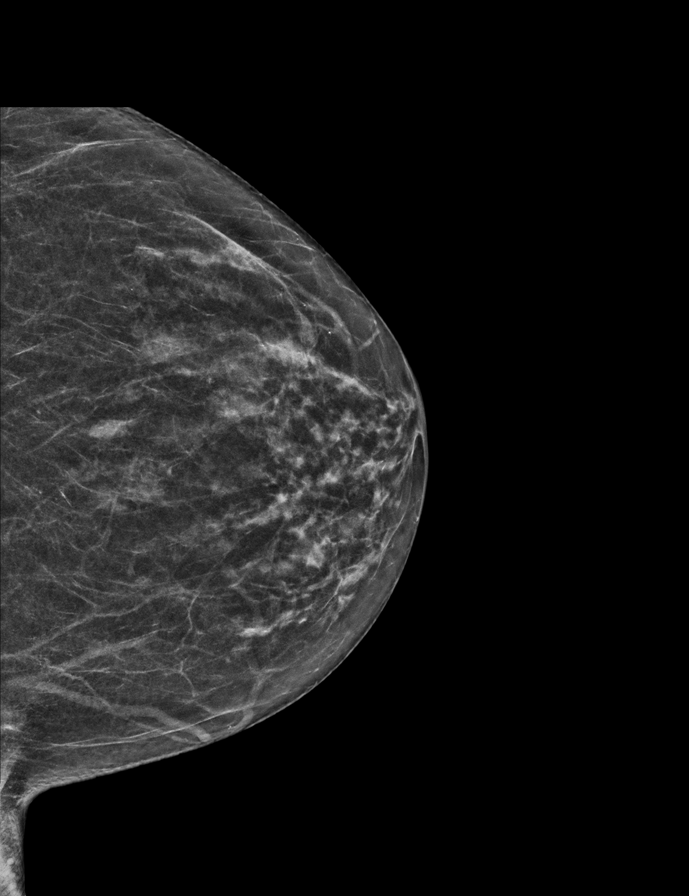

[R CC synth-2D]
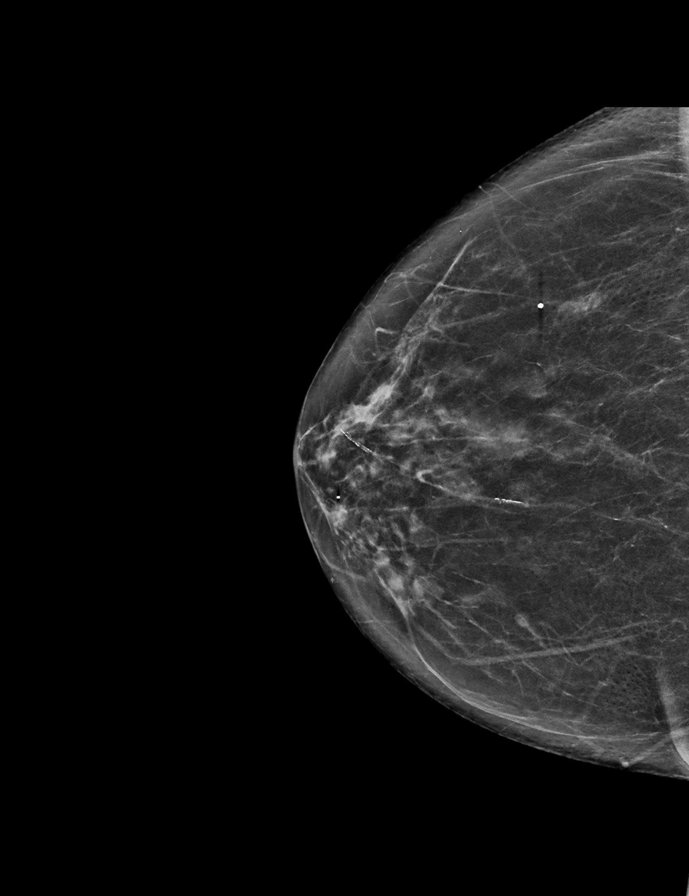

[L MLO synth-2D]
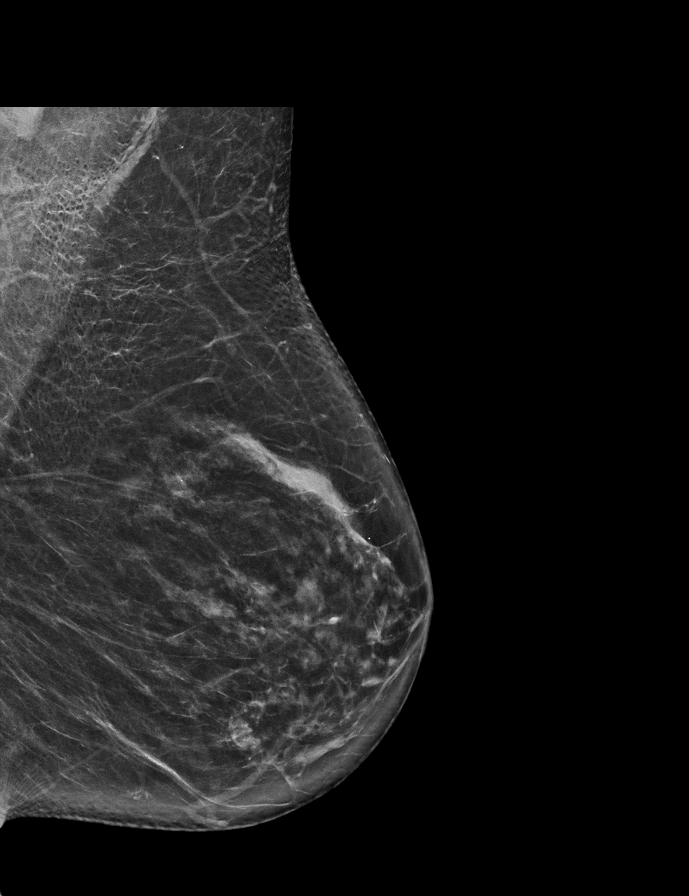

[R MLO tomo · 2 of 67 frames shown]
[frame 22/67]
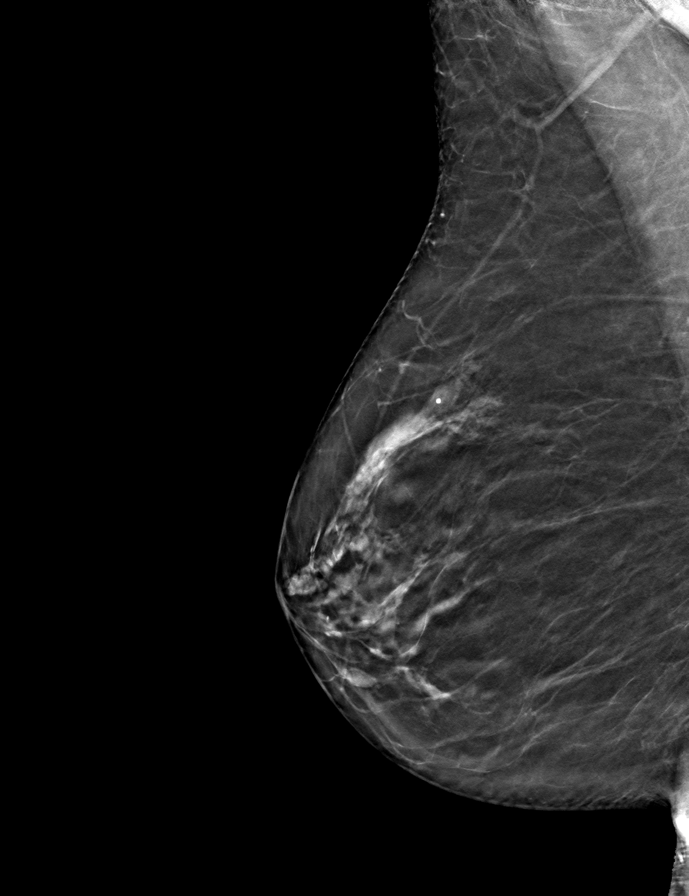
[frame 34/67]
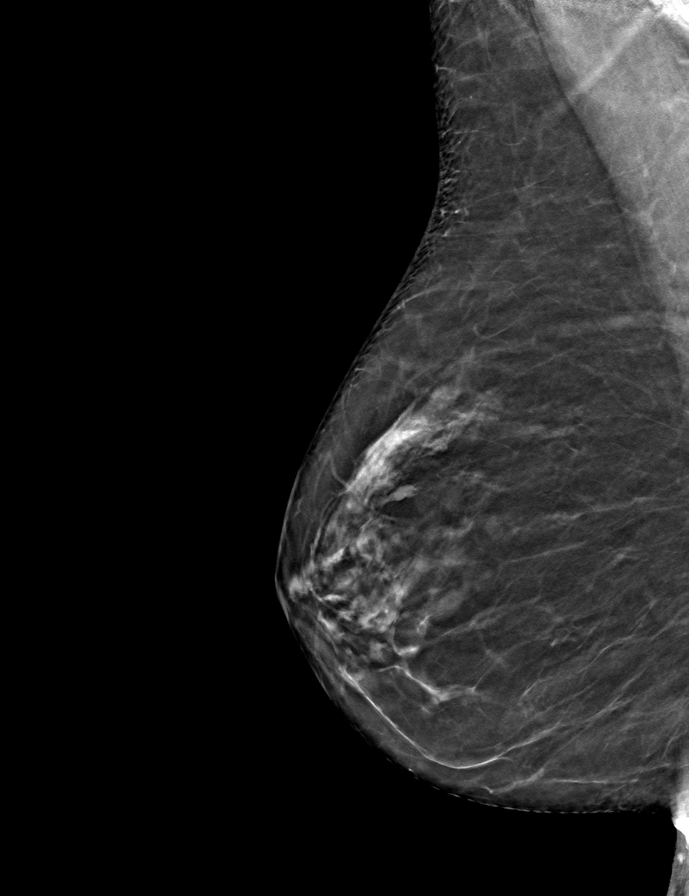

[L MLO tomo · tomo slice 35/69.0]
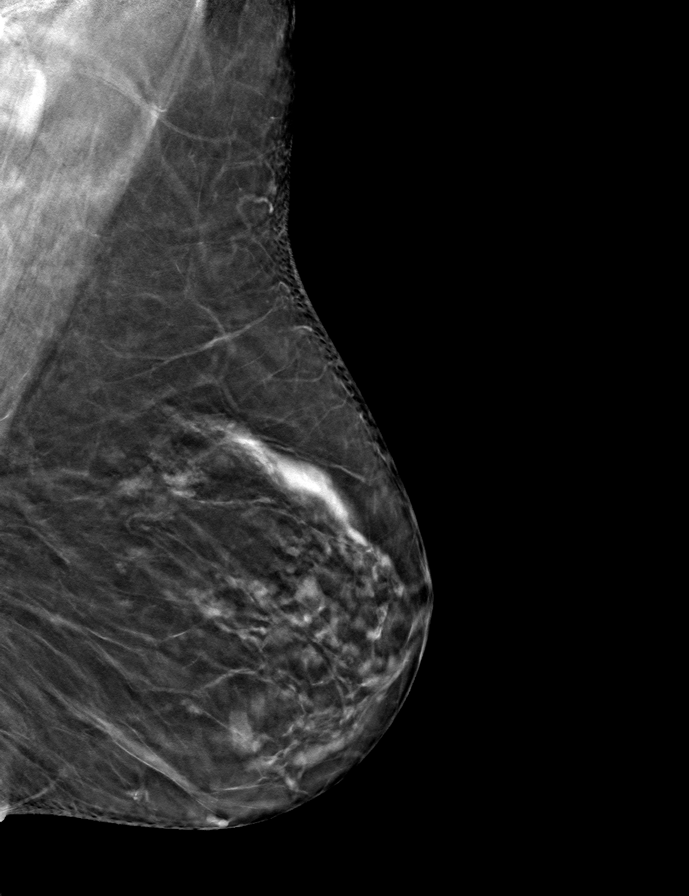

[R CC tomo · tomo slice 32/63.0]
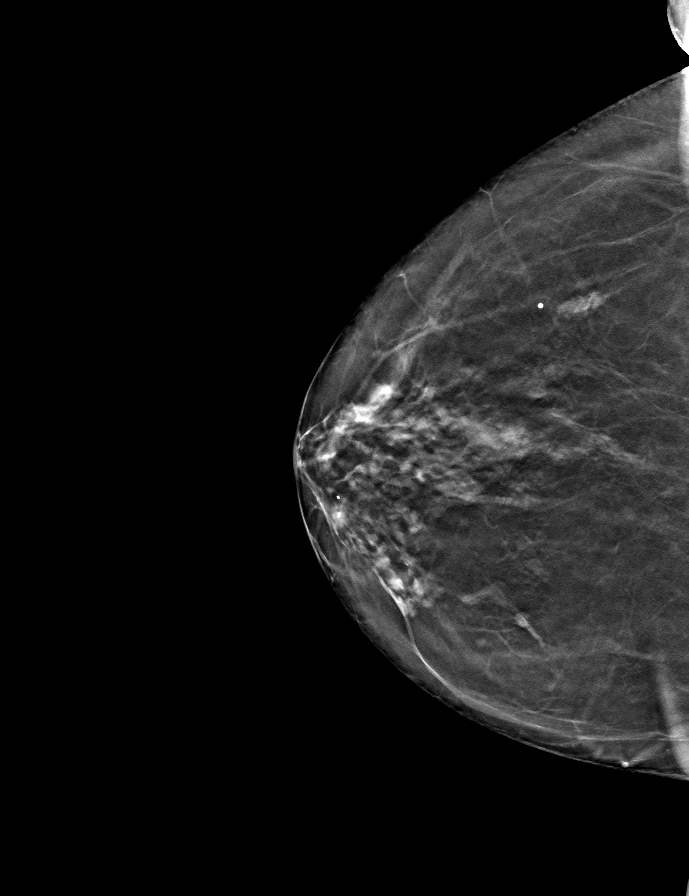

[L CC tomo · tomo slice 25/49.0]
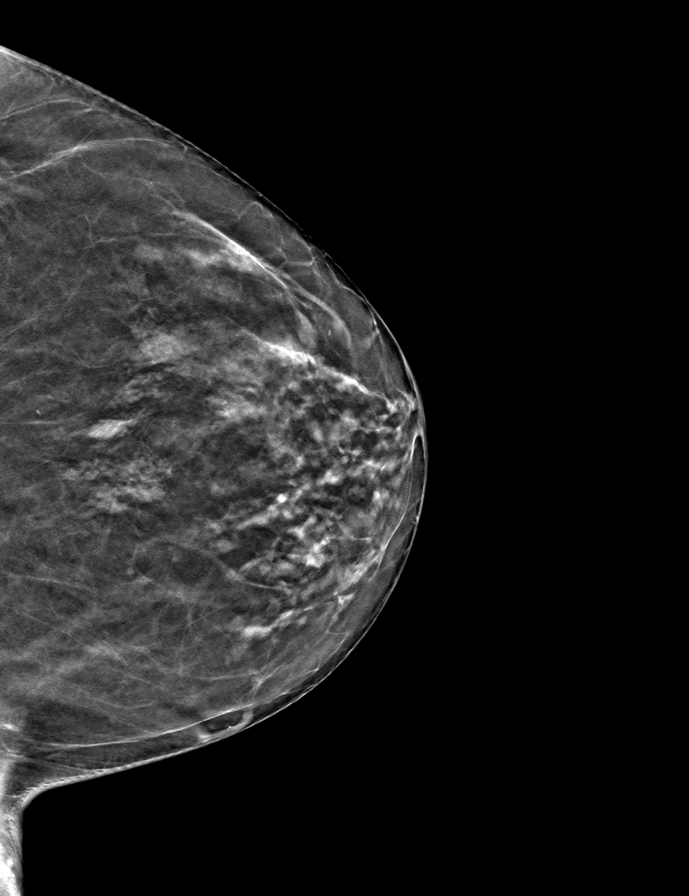

[9 of 24 positions shown; findings below may reference images not displayed]

ACR Breast Density Category b: There are scattered areas of
fibroglandular density.
FINDINGS: There are no findings suspicious for malignancy. Images were
processed with CAD.
IMPRESSION: No mammographic evidence of malignancy. A result letter of this
screening mammogram will be mailed directly to the patient.

RECOMMENDATION:
Screening mammogram in one year. (Code:CN-U-775)

BI-RADS CATEGORY  1: Negative.

## 2018-07-23 ENCOUNTER — Other Ambulatory Visit: Payer: Self-pay

## 2018-07-23 DIAGNOSIS — E039 Hypothyroidism, unspecified: Secondary | ICD-10-CM

## 2018-07-23 MED ORDER — LEVOTHYROXINE SODIUM 75 MCG PO TABS: 75.0000 ug | ORAL_TABLET | Freq: Every day | ORAL | 4 refills | Status: DC

## 2018-07-30 NOTE — Progress Notes (Signed)
Chief complaint: 1.  Cystocele 2.  Status post anterior/posterior colporrhaphy with enterocele ligation 2016  Patient presents for one-year follow-up.  She is not sexually active at this time as husband has had significant dementia issues.  Bowel function is normal.  Bladder function is normal.  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 She does not report any vaginal discharge, vaginal bleeding, pelvic pain.  Past Medical History:  Diagnosis Date  . Abnormal heart rhythm   . Cervical lesion   . Hypothyroidism   . Osteoporosis   . Vaginal atrophy   . Vaginal atrophy     Past Surgical History:  Procedure Laterality Date  . ABDOMINAL HYSTERECTOMY    . ANTERIOR AND POSTERIOR VAGINAL REPAIR  12/15/2014,2010  . ENTEROCELE REPAIR    . TONSILLECTOMY    . TUBAL LIGATION     Review of systems: Complete review of systems is negative including that identified within the HPI  OBJECTIVE: BP (!) 179/67   Pulse 86   Ht 5\' 2"  (1.575 m)   Wt 112 lb 9.6 oz (51.1 kg)   BMI 20.59 kg/m  Patient is an elderly, pleasant female in no acute distress.  She is alert and oriented. Abdomen: Soft, nontender, without organomegaly.  Back: No CVA tenderness Pelvic exam: External genitalia-mild atrophic changes. BUS-small urethral caruncle, otherwise normal. Vagina-second cystocele; no enterocele; no rectocele.  (Unchanged from last year) Cervix-surgically absent Uterus-surgically absent Bimanual-no palpable masses or tenderness Rectovaginal-normal.  External exam; normal sphincter tone,; no rectal masses.  ASSESSMENT: 1.  Status post anterior/posterior colporrhaphy with enterocele ligation 2016 2.  Mild recurrence of cystocele, asymptomatic 3.  No longer sexually active and no longer experiencing pelvic pain  PLAN: 1.  Discontinue vaginal estrogen 2.  Avoid heavy chronic lifting 3.  Return in 1 year for follow-up  A total of 15 minutes were spent face-to-face  with the patient during this encounter and over half of that time dealt with counseling and coordination of care.   Darol Destinerystal Miller, CMA  Herold HarmsMartin A Daemion Mcniel, MD  Note: This dictation was prepared with Dragon dictation along with smaller phrase technology. Any transcriptional errors that result from this process are unintentional.

## 2018-07-31 DIAGNOSIS — H04123 Dry eye syndrome of bilateral lacrimal glands: Secondary | ICD-10-CM | POA: Diagnosis not present

## 2018-07-31 DIAGNOSIS — H353131 Nonexudative age-related macular degeneration, bilateral, early dry stage: Secondary | ICD-10-CM | POA: Diagnosis not present

## 2018-07-31 DIAGNOSIS — H01006 Unspecified blepharitis left eye, unspecified eyelid: Secondary | ICD-10-CM | POA: Diagnosis not present

## 2018-07-31 DIAGNOSIS — H01003 Unspecified blepharitis right eye, unspecified eyelid: Secondary | ICD-10-CM | POA: Diagnosis not present

## 2018-07-31 DIAGNOSIS — H2513 Age-related nuclear cataract, bilateral: Secondary | ICD-10-CM | POA: Diagnosis not present

## 2018-08-01 ENCOUNTER — Encounter: Payer: Self-pay | Admitting: Obstetrics and Gynecology

## 2018-08-01 ENCOUNTER — Ambulatory Visit (INDEPENDENT_AMBULATORY_CARE_PROVIDER_SITE_OTHER): Payer: PPO | Admitting: Obstetrics and Gynecology

## 2018-08-01 VITALS — BP 179/67 | HR 86 | Ht 62.0 in | Wt 112.6 lb

## 2018-08-01 DIAGNOSIS — Z780 Asymptomatic menopausal state: Secondary | ICD-10-CM | POA: Diagnosis not present

## 2018-08-01 DIAGNOSIS — N8111 Cystocele, midline: Secondary | ICD-10-CM | POA: Diagnosis not present

## 2018-08-01 DIAGNOSIS — N952 Postmenopausal atrophic vaginitis: Secondary | ICD-10-CM | POA: Diagnosis not present

## 2018-08-01 NOTE — Patient Instructions (Signed)
1.  Continue routine activities while avoiding chronic heavy lifting 2.  Discontinue estrogen cream at this time 3.  Return in 1 year for follow-up

## 2018-08-17 DIAGNOSIS — M9903 Segmental and somatic dysfunction of lumbar region: Secondary | ICD-10-CM | POA: Diagnosis not present

## 2018-08-17 DIAGNOSIS — M7631 Iliotibial band syndrome, right leg: Secondary | ICD-10-CM | POA: Diagnosis not present

## 2018-08-24 DIAGNOSIS — M7631 Iliotibial band syndrome, right leg: Secondary | ICD-10-CM | POA: Diagnosis not present

## 2018-08-24 DIAGNOSIS — M9903 Segmental and somatic dysfunction of lumbar region: Secondary | ICD-10-CM | POA: Diagnosis not present

## 2018-08-31 DIAGNOSIS — M7631 Iliotibial band syndrome, right leg: Secondary | ICD-10-CM | POA: Diagnosis not present

## 2018-08-31 DIAGNOSIS — M9903 Segmental and somatic dysfunction of lumbar region: Secondary | ICD-10-CM | POA: Diagnosis not present

## 2018-09-07 DIAGNOSIS — M7631 Iliotibial band syndrome, right leg: Secondary | ICD-10-CM | POA: Diagnosis not present

## 2018-09-07 DIAGNOSIS — M9903 Segmental and somatic dysfunction of lumbar region: Secondary | ICD-10-CM | POA: Diagnosis not present

## 2018-10-05 DIAGNOSIS — M9903 Segmental and somatic dysfunction of lumbar region: Secondary | ICD-10-CM | POA: Diagnosis not present

## 2018-10-05 DIAGNOSIS — M7631 Iliotibial band syndrome, right leg: Secondary | ICD-10-CM | POA: Diagnosis not present

## 2018-10-19 ENCOUNTER — Encounter: Payer: Self-pay | Admitting: Nurse Practitioner

## 2018-10-19 ENCOUNTER — Ambulatory Visit (INDEPENDENT_AMBULATORY_CARE_PROVIDER_SITE_OTHER): Payer: PPO | Admitting: Nurse Practitioner

## 2018-10-19 VITALS — BP 152/80 | HR 79 | Resp 16 | Ht 62.0 in | Wt 109.8 lb

## 2018-10-19 DIAGNOSIS — R030 Elevated blood-pressure reading, without diagnosis of hypertension: Secondary | ICD-10-CM | POA: Diagnosis not present

## 2018-10-19 DIAGNOSIS — E039 Hypothyroidism, unspecified: Secondary | ICD-10-CM | POA: Diagnosis not present

## 2018-10-19 NOTE — Progress Notes (Signed)
[facility] [address] [place], [place] [address]  Internal MEDICINE  Office Visit Note  Patient Name: Tiffany Bruce  [id number]  [id number]  Date of Service: 10/21/2018  Chief Complaint  Patient presents with  . Medical Management of Chronic Issues    6 month follow up    The patient recently had like line health screen. She had ECG, carotid ultrasound, ultrasound of her abdomen to evaluate for abdominal aorta. All of these tests were normal. She did show that she has osteoporosis. This is something that is already known. Patient does not wish to start on bisphosphonate. She does take calcium and vitamin d. She is physically active every day. Works part time at [employer] and goes to [employer] most days. She has not been able to go to the gym recently as she has been taking care of her husband who fell recently.      Current Medication: Outpatient Encounter Medications as of 10/19/2018  Medication Sig  . Ascorbic Acid (VITAMIN C) 100 MG tablet Take 100 mg by mouth daily.  . calcium-vitamin D (OSCAL WITH D) 250-125 MG-UNIT per tablet Take 1 tablet by mouth daily.  Marland Kitchen docusate sodium (COLACE) 100 MG capsule Take 100 mg by mouth 2 (two) times daily.  Marland Kitchen levothyroxine (SYNTHROID, LEVOTHROID) 75 MCG tablet Take 1 tablet (75 mcg total) by mouth daily before breakfast.  . vitamin A 21308 UNIT capsule Take 10,000 Units by mouth daily.  . vitamin B-12 (CYANOCOBALAMIN) 100 MCG tablet Take 100 mcg by mouth daily.  . vitamin E 100 UNIT capsule Take by mouth daily.   No facility-administered encounter medications on file as of 10/19/2018.     Surgical History: Past Surgical History:  Procedure Laterality Date  . ABDOMINAL HYSTERECTOMY    . ANTERIOR AND POSTERIOR VAGINAL REPAIR  12/15/2014,2010  . ENTEROCELE REPAIR    . TONSILLECTOMY    . TUBAL LIGATION      Medical History: Past Medical History:  Diagnosis Date  . Abnormal heart rhythm   . Cervical lesion   .  Hypothyroidism   . Osteoporosis   . Vaginal atrophy   . Vaginal atrophy     Family History: Family History  Problem Relation Age of Onset  . Cancer Neg Hx   . Diabetes Neg Hx   . Heart disease Neg Hx     Social History   Socioeconomic History  . Marital status: Married    Spouse name: Not on file  . Number of children: Not on file  . Years of education: Not on file  . Highest education level: Not on file  Occupational History  . Not on file  Social Needs  . Financial resource strain: Not on file  . Food insecurity:    Worry: Not on file    Inability: Not on file  . Transportation needs:    Medical: Not on file    Non-medical: Not on file  Tobacco Use  . Smoking status: Never Smoker  . Smokeless tobacco: Never Used  Substance and Sexual Activity  . Alcohol use: No  . Drug use: No  . Sexual activity: Not Currently    Birth control/protection: Surgical  Lifestyle  . Physical activity:    Days per week: Not on file    Minutes per session: Not on file  . Stress: Not on file  Relationships  . Social connections:    Talks on phone: Not on file    Gets together:  Not on file    Attends religious service: Not on file    Active member of club or organization: Not on file    Attends meetings of clubs or organizations: Not on file    Relationship status: Not on file  . Intimate partner violence:    Fear of current or ex partner: Not on file    Emotionally abused: Not on file    Physically abused: Not on file    Forced sexual activity: Not on file  Other Topics Concern  . Not on file  Social History Narrative  . Not on file      Review of Systems  Constitutional: Negative for activity change, chills, fatigue and unexpected weight change.  HENT: Negative for congestion, postnasal drip, rhinorrhea, sneezing and sore throat.   Eyes: Negative.  Negative for redness.  Respiratory: Negative for cough, chest tightness and shortness of breath.   Cardiovascular:  Negative for chest pain and palpitations.       Patient's blood pressure is elevated today.  Gastrointestinal: Negative for abdominal pain, constipation, diarrhea, nausea and vomiting.  Endocrine:       Well controlled thyroid disease.   Genitourinary: Negative for dysuria and frequency.  Musculoskeletal: Negative for arthralgias, back pain, joint swelling and neck pain.  Skin: Negative for rash.  Allergic/Immunologic: Negative for environmental allergies.  Neurological: Negative for dizziness, tremors, numbness and headaches.  Hematological: Negative for adenopathy. Does not bruise/bleed easily.  Psychiatric/Behavioral: Negative for behavioral problems (Depression), sleep disturbance and suicidal ideas. The patient is not nervous/anxious.     Today's Vitals   10/19/18 1123  BP: (!) 152/80  Pulse: 79  Resp: 16  SpO2: 96%  Weight: 109 lb 12.8 oz (49.8 kg)  Height: 5\' 2"  (1.575 m)    Physical Exam  Constitutional: She is oriented to person, place, and time. She appears well-developed and well-nourished. No distress.  HENT:  Head: Normocephalic and atraumatic.  Mouth/Throat: No oropharyngeal exudate.  Eyes: Pupils are equal, round, and reactive to light. EOM are normal.  Neck: Normal range of motion. Neck supple. No JVD present. Carotid bruit is not present. No tracheal deviation present. No thyromegaly present.  Cardiovascular: Normal rate, regular rhythm and normal heart sounds. Exam reveals no gallop and no friction rub.  No murmur heard. Pulmonary/Chest: Effort normal and breath sounds normal. No respiratory distress. She has no wheezes. She has no rales. She exhibits no tenderness.  Abdominal: Soft. Bowel sounds are normal. There is no tenderness.  Musculoskeletal: Normal range of motion.  Lymphadenopathy:    She has no cervical adenopathy.  Neurological: She is alert and oriented to person, place, and time. She displays normal reflexes. No cranial nerve deficit.  Skin: Skin  is warm and dry. She is not diaphoretic.  Psychiatric: She has a normal mood and affect. Her behavior is normal. Judgment and thought content normal.  Nursing note and vitals reviewed.  Assessment/Plan: 1. Elevated blood-pressure reading without diagnosis of hypertension Doing well without adding medications. Ask that she reduce salt and increase water in her diet. Monitor closely. Reassess at next visit.   2. Acquired hypothyroidism Thyroid panel stable. Continue levothyroxine as prescribed. Recheck after next visit.   General Counseling: Tyson Babinskiora verbalizes understanding of the findings of todays visit and agrees with plan of treatment. I have discussed any further diagnostic evaluation that may be needed or ordered today. We also reviewed her medications today. she has been encouraged to call the office with any questions or concerns  that should arise related to todays visit.  Hypertension Counseling:   The following hypertensive lifestyle modification were recommended and discussed:  1. Limiting alcohol intake to less than 1 oz/day of ethanol:(24 oz of beer or 8 oz of wine or 2 oz of 100-proof whiskey). 2. Take baby ASA 81 mg daily. 3. Importance of regular aerobic exercise and losing weight. 4. Reduce dietary saturated fat and cholesterol intake for overall cardiovascular health. 5. Maintaining adequate dietary potassium, calcium, and magnesium intake. 6. Regular monitoring of the blood pressure. 7. Reduce sodium intake to less than 100 mmol/day (less than 2.3 gm of sodium or less than 6 gm of sodium choride)   This patient was seen by Vincent Gros FNP Collaboration with Dr Lyndon Code as a part of collaborative care agreement  Time spent: 15 Minutes      Dr Lyndon Code Internal medicine

## 2018-10-21 DIAGNOSIS — R030 Elevated blood-pressure reading, without diagnosis of hypertension: Secondary | ICD-10-CM | POA: Insufficient documentation

## 2018-10-22 ENCOUNTER — Ambulatory Visit: Payer: Self-pay | Admitting: Nurse Practitioner

## 2019-03-12 ENCOUNTER — Other Ambulatory Visit: Payer: Self-pay | Admitting: Nurse Practitioner

## 2019-03-12 ENCOUNTER — Other Ambulatory Visit: Payer: Self-pay | Admitting: Internal Medicine

## 2019-03-12 DIAGNOSIS — Z1231 Encounter for screening mammogram for malignant neoplasm of breast: Secondary | ICD-10-CM

## 2019-04-19 ENCOUNTER — Other Ambulatory Visit: Payer: Self-pay

## 2019-04-19 ENCOUNTER — Encounter: Payer: Self-pay | Admitting: Nurse Practitioner

## 2019-04-19 ENCOUNTER — Ambulatory Visit (INDEPENDENT_AMBULATORY_CARE_PROVIDER_SITE_OTHER): Payer: PPO | Admitting: Nurse Practitioner

## 2019-04-19 VITALS — BP 132/70 | HR 73 | Resp 16 | Ht 61.0 in | Wt 116.2 lb

## 2019-04-19 VITALS — BP 132/78 | HR 73 | Resp 16 | Ht 61.0 in | Wt 116.2 lb

## 2019-04-19 DIAGNOSIS — R300 Dysuria: Secondary | ICD-10-CM

## 2019-04-19 DIAGNOSIS — Z0001 Encounter for general adult medical examination with abnormal findings: Secondary | ICD-10-CM | POA: Diagnosis not present

## 2019-04-19 DIAGNOSIS — R030 Elevated blood-pressure reading, without diagnosis of hypertension: Secondary | ICD-10-CM | POA: Diagnosis not present

## 2019-04-19 DIAGNOSIS — E039 Hypothyroidism, unspecified: Secondary | ICD-10-CM

## 2019-04-19 NOTE — Progress Notes (Signed)
[facility] [address] [place], [place] [address]  Internal MEDICINE  Office Visit Note  Patient Name: Tiffany Bruce  [id number]  [id number]  Date of Service: 05/06/2019   Pt is here for routine health maintenance examination   Chief Complaint  Patient presents with  . Medicare Wellness  . Osteoporosis  . Hypothyroidism     The patient is here for health maintenance exam. Blood pressure is slightly elevated today. She states that she has some mild lower back pain today. Feels like this may be contributing to her blood pressure being elevated. She states that she is otherwise, feeling well. She is due to have routine, fasting labs drawn in 05/2019 and she is already scheduled for mammogram in 05/2019.    Current Medication: Outpatient Encounter Medications as of 04/19/2019  Medication Sig  . Ascorbic Acid (VITAMIN C) 100 MG tablet Take 100 mg by mouth daily.  Marland Kitchen azelastine (ASTELIN) 0.1 % nasal spray 2 sprays.  . calcium-vitamin D (OSCAL WITH D) 250-125 MG-UNIT per tablet Take 1 tablet by mouth daily.  Marland Kitchen docusate sodium (COLACE) 100 MG capsule Take 100 mg by mouth 2 (two) times daily.  . fluticasone (FLONASE) 50 MCG/ACT nasal spray 2 sprays.  Marland Kitchen levothyroxine (SYNTHROID, LEVOTHROID) 75 MCG tablet Take 1 tablet (75 mcg total) by mouth daily before breakfast.  . vitamin A 53299 UNIT capsule Take 10,000 Units by mouth daily.  . vitamin B-12 (CYANOCOBALAMIN) 100 MCG tablet Take 100 mcg by mouth daily.  . vitamin E 100 UNIT capsule Take by mouth daily.   No facility-administered encounter medications on file as of 04/19/2019.     Surgical History: Past Surgical History:  Procedure Laterality Date  . ABDOMINAL HYSTERECTOMY    . ANTERIOR AND POSTERIOR VAGINAL REPAIR  12/15/2014,2010  . ENTEROCELE REPAIR    . TONSILLECTOMY    . TUBAL LIGATION      Medical History: Past Medical History:  Diagnosis Date  . Abnormal heart rhythm   . Cervical lesion   .  Hypothyroidism   . Osteoporosis   . Vaginal atrophy   . Vaginal atrophy     Family History: Family History  Problem Relation Age of Onset  . Cancer Neg Hx   . Diabetes Neg Hx   . Heart disease Neg Hx       Review of Systems  Constitutional: Negative for activity change, chills, fatigue and unexpected weight change.  HENT: Negative for congestion, postnasal drip, rhinorrhea, sneezing and sore throat.   Respiratory: Negative for cough, chest tightness and shortness of breath.   Cardiovascular: Negative for chest pain and palpitations.  Gastrointestinal: Negative for abdominal pain, constipation, diarrhea, nausea and vomiting.  Endocrine: Negative for cold intolerance, heat intolerance, polydipsia and polyuria.       Well controlled thyroid disease.   Genitourinary: Negative for dysuria and frequency.  Musculoskeletal: Negative for arthralgias, back pain, joint swelling and neck pain.  Skin: Negative for rash.  Allergic/Immunologic: Negative for environmental allergies.  Neurological: Negative for dizziness, tremors, numbness and headaches.  Hematological: Negative for adenopathy. Does not bruise/bleed easily.  Psychiatric/Behavioral: Negative for behavioral problems (Depression), sleep disturbance and suicidal ideas. The patient is nervous/anxious.      Today's Vitals   04/19/19 0913  BP: 132/78  Pulse: 73  Resp: 16  SpO2: 98%  Weight: 116 lb 3.2 oz (52.7 kg)  Height: 5\' 1"  (1.549 m)   Body mass index is 21.96 kg/m.  Physical Exam Vitals signs and nursing  note reviewed.  Constitutional:      General: She is not in acute distress.    Appearance: Normal appearance. She is well-developed. She is not diaphoretic.  HENT:     Head: Normocephalic and atraumatic.     Nose: Nose normal.     Mouth/Throat:     Pharynx: No oropharyngeal exudate.  Eyes:     Pupils: Pupils are equal, round, and reactive to light.  Neck:     Musculoskeletal: Normal range of motion and neck  supple.     Thyroid: No thyromegaly.     Vascular: No carotid bruit or JVD.     Trachea: No tracheal deviation.  Cardiovascular:     Rate and Rhythm: Normal rate and regular rhythm.     Pulses: Normal pulses.     Heart sounds: Normal heart sounds. No murmur. No friction rub. No gallop.   Pulmonary:     Effort: Pulmonary effort is normal. No respiratory distress.     Breath sounds: Normal breath sounds. No wheezing or rales.  Chest:     Chest wall: No tenderness.     Breasts:        Right: Normal. No swelling, bleeding, inverted nipple, mass, nipple discharge, skin change or tenderness.        Left: Normal. No swelling, bleeding, inverted nipple, mass, nipple discharge, skin change or tenderness.  Abdominal:     General: Bowel sounds are normal.     Palpations: Abdomen is soft.     Tenderness: There is no abdominal tenderness.  Musculoskeletal: Normal range of motion.  Lymphadenopathy:     Cervical: No cervical adenopathy.  Skin:    General: Skin is warm and dry.  Neurological:     Mental Status: She is alert and oriented to person, place, and time.     Cranial Nerves: No cranial nerve deficit.     Deep Tendon Reflexes: Reflexes normal.  Psychiatric:        Behavior: Behavior normal.        Thought Content: Thought content normal.        Judgment: Judgment normal.    Dep[facility] 04/19/2019 10/19/2018 04/16/2018  Decreased Interest 0 0 0  Down, Depressed, Hopeless 0 0 0  PHQ - 2 Score 0 0 0    Functional Status Survey: Is the patient deaf or have difficulty hearing?: No Does the patient have difficulty seeing, even when wearing glasses/contacts?: No Does the patient have difficulty concentrating, remembering, or making decisions?: No Does the patient have difficulty walking or climbing stairs?: No Does the patient have difficulty dressing or bathing?: No Does the patient have difficulty doing errands alone such as visiting a doctor's office or shopping?:  No  MMSE - Mini Mental State Exam 04/19/2019 04/16/2018  Orientation to time 5 5  Orientation to Place 5 5  Registration 3 3  Attention/ Calculation 5 3  Recall 3 3  Language- name 2 objects 2 2  Language- repeat 1 1  Language- follow 3 step command 3 3  Language- read & follow direction 1 1  Write a sentence 1 1  Copy design 1 1  Total score 30 28    Fall Risk  04/19/2019 04/19/2019 10/19/2018 04/16/2018  Falls in the past year? 0 0 0 No      LABS: Recent Results (from the past 2160 hour(s))  UA/M w/rflx Culture, Routine     Status: Abnormal   Collection Time: 04/19/19  9:18 AM  Result  Value Ref Range   Specific Gravity, UA 1.008 1.005 - 1.030   pH, UA 5.0 5.0 - 7.5   Color, UA Yellow Yellow   Appearance Ur Clear Clear   Leukocytes,UA Trace (A) Negative   Protein,UA Negative Negative/Trace   Glucose, UA Negative Negative   Ketones, UA Negative Negative   RBC, UA Negative Negative   Bilirubin, UA Negative Negative   Urobilinogen, Ur 0.2 0.2 - 1.0 mg/dL   Nitrite, UA Negative Negative   Microscopic Examination See below:     Comment: Microscopic was indicated and was performed.   Urinalysis Reflex Comment     Comment: This specimen has reflexed to a Urine Culture.  Microscopic Examination     Status: None   Collection Time: 04/19/19  9:18 AM  Result Value Ref Range   WBC, UA 0-5 0 - 5 /hpf   RBC 0-2 0 - 2 /hpf   Epithelial Cells (non renal) 0-10 0 - 10 /hpf   Casts None seen None seen /lpf   Mucus, UA Present Not Estab.   Bacteria, UA None seen None seen/Few  Urine Culture, Reflex     Status: None   Collection Time: 04/19/19  9:18 AM  Result Value Ref Range   Urine Culture, Routine Final report    Organism ID, Bacteria Comment     Comment: Mixed urogenital flora 10,000-25,000 colony forming units per mL     Assessment/Plan: 1. Encounter for general adult medical examination with abnormal findings Annual health maintenance exam today  2. Acquired  hypothyroidism thyrod panel normal. Continue levothyroxine as prescribed   3. Elevated blood-pressure reading without diagnosis of hypertension Stable. Controlled without medication. Will monitor closely.   4. Dysuria - UA/M w/rflx Culture, Routine  General Counseling: Palyn verbalizes understanding of the findings of todays visit and agrees with plan of treatment. I have discussed any further diagnostic evaluation that may be needed or ordered today. We also reviewed her medications today. she has been encouraged to call the office with any questions or concerns that should arise related to todays visit.    Counseling:  This patient was seen by Vincent Gros FNP Collaboration with Dr Lyndon Code as a part of collaborative care agreement  Orders Placed This Encounter  Procedures  . Microscopic Examination  . Urine Culture, Reflex  . UA/M w/rflx Culture, Routine    Time spent: 31 Minutes      Lyndon Code, MD  Internal Medicine

## 2019-04-19 NOTE — Progress Notes (Signed)
]  pt blood pressure elevated, she have been climbing stairs, back pain and some coffee this morning,  When checked at home the reading were 123/67 140/76 Pulse 87 Informed provider.

## 2019-04-21 LAB — UA/M W/RFLX CULTURE, ROUTINE
Bilirubin, UA: NEGATIVE
Glucose, UA: NEGATIVE
Ketones, UA: NEGATIVE
Nitrite, UA: NEGATIVE
Protein,UA: NEGATIVE
RBC, UA: NEGATIVE
Specific Gravity, UA: 1.008 (ref 1.005–1.030)
Urobilinogen, Ur: 0.2 mg/dL (ref 0.2–1.0)
pH, UA: 5 (ref 5.0–7.5)

## 2019-04-21 LAB — MICROSCOPIC EXAMINATION
Bacteria, UA: NONE SEEN
Casts: NONE SEEN /lpf

## 2019-04-21 LAB — URINE CULTURE, REFLEX

## 2019-05-24 ENCOUNTER — Ambulatory Visit
Admission: RE | Admit: 2019-05-24 | Discharge: 2019-05-24 | Disposition: A | Discharge status: Home / Self Care | Payer: PPO | Source: Ambulatory Visit | Attending: Nurse Practitioner | Admitting: Nurse Practitioner

## 2019-05-24 ENCOUNTER — Other Ambulatory Visit: Payer: Self-pay

## 2019-05-24 DIAGNOSIS — Z1231 Encounter for screening mammogram for malignant neoplasm of breast: Secondary | ICD-10-CM | POA: Diagnosis not present

## 2019-05-27 ENCOUNTER — Other Ambulatory Visit: Payer: Self-pay | Admitting: Nurse Practitioner

## 2019-05-27 DIAGNOSIS — E559 Vitamin D deficiency, unspecified: Secondary | ICD-10-CM | POA: Diagnosis not present

## 2019-05-27 DIAGNOSIS — E039 Hypothyroidism, unspecified: Secondary | ICD-10-CM | POA: Diagnosis not present

## 2019-05-27 DIAGNOSIS — I10 Essential (primary) hypertension: Secondary | ICD-10-CM | POA: Diagnosis not present

## 2019-05-27 DIAGNOSIS — Z0001 Encounter for general adult medical examination with abnormal findings: Secondary | ICD-10-CM | POA: Diagnosis not present

## 2019-05-27 LAB — LIPID PANEL W/O CHOL/HDL RATIO

## 2019-05-28 LAB — CBC
Hematocrit: 40.8 % (ref 34.0–46.6)
Hemoglobin: 13.8 g/dL (ref 11.1–15.9)
MCH: 30.4 pg (ref 26.6–33.0)
MCHC: 33.8 g/dL (ref 31.5–35.7)
MCV: 90 fL (ref 79–97)
Platelets: 300 10*3/uL (ref 150–450)
RBC: 4.54 x10E6/uL (ref 3.77–5.28)
RDW: 12.8 % (ref 11.7–15.4)
WBC: 7.4 10*3/uL (ref 3.4–10.8)

## 2019-05-28 LAB — COMPREHENSIVE METABOLIC PANEL
ALT: 18 IU/L (ref 0–32)
AST: 21 IU/L (ref 0–40)
Albumin/Globulin Ratio: 2 (ref 1.2–2.2)
Albumin: 4.1 g/dL (ref 3.6–4.6)
Alkaline Phosphatase: 94 IU/L (ref 39–117)
BUN/Creatinine Ratio: 19 (ref 12–28)
BUN: 13 mg/dL (ref 8–27)
Bilirubin Total: 0.3 mg/dL (ref 0.0–1.2)
CO2: 25 mmol/L (ref 20–29)
Calcium: 9.5 mg/dL (ref 8.7–10.3)
Chloride: 103 mmol/L (ref 96–106)
Creatinine, Ser: 0.69 mg/dL (ref 0.57–1.00)
GFR calc Af Amer: 94 mL/min/{1.73_m2} (ref >59)
GFR calc non Af Amer: 81 mL/min/{1.73_m2} (ref >59)
Globulin, Total: 2.1 g/dL (ref 1.5–4.5)
Glucose: 114 mg/dL — ABNORMAL HIGH (ref 65–99)
Potassium: 4.8 mmol/L (ref 3.5–5.2)
Sodium: 143 mmol/L (ref 134–144)
Total Protein: 6.2 g/dL (ref 6.0–8.5)

## 2019-05-28 LAB — T4, FREE: Free T4: 1.35 ng/dL (ref 0.82–1.77)

## 2019-05-28 LAB — T3: T3, Total: 85 ng/dL (ref 71–180)

## 2019-05-28 LAB — LIPID PANEL W/O CHOL/HDL RATIO
Cholesterol, Total: 224 mg/dL — ABNORMAL HIGH (ref 100–199)
HDL: 48 mg/dL (ref >39)
LDL Calculated: 146 mg/dL — ABNORMAL HIGH (ref 0–99)
Triglycerides: 150 mg/dL — ABNORMAL HIGH (ref 0–149)
VLDL Cholesterol Cal: 30 mg/dL (ref 5–40)

## 2019-05-28 LAB — TSH: TSH: 0.046 u[IU]/mL — ABNORMAL LOW (ref 0.450–4.500)

## 2019-05-28 LAB — VITAMIN D 25 HYDROXY (VIT D DEFICIENCY, FRACTURES): Vit D, 25-Hydroxy: 51.9 ng/mL (ref 30.0–100.0)

## 2019-05-29 ENCOUNTER — Telehealth: Payer: Self-pay

## 2019-05-29 NOTE — Telephone Encounter (Signed)
Pt advised for labs see other message

## 2019-05-29 NOTE — Telephone Encounter (Signed)
-----   Message from Ronnell Freshwater, NP sent at 05/29/2019 12:17 PM EDT ----- Marykay Lex. Can you let the patient know her cholesterol was mildly elevated on her labs. She should avoid fried and fatty foods and try to increase activity. All other labs look good right now. Thanks.

## 2019-07-25 ENCOUNTER — Other Ambulatory Visit: Payer: Self-pay

## 2019-07-25 DIAGNOSIS — E039 Hypothyroidism, unspecified: Secondary | ICD-10-CM

## 2019-07-25 MED ORDER — LEVOTHYROXINE SODIUM 75 MCG PO TABS: 75.0000 ug | ORAL_TABLET | Freq: Every day | ORAL | 4 refills | Status: DC

## 2019-08-02 DIAGNOSIS — H353131 Nonexudative age-related macular degeneration, bilateral, early dry stage: Secondary | ICD-10-CM | POA: Diagnosis not present

## 2019-08-02 DIAGNOSIS — H2513 Age-related nuclear cataract, bilateral: Secondary | ICD-10-CM | POA: Diagnosis not present

## 2019-08-02 DIAGNOSIS — H04123 Dry eye syndrome of bilateral lacrimal glands: Secondary | ICD-10-CM | POA: Diagnosis not present

## 2019-08-02 DIAGNOSIS — H01003 Unspecified blepharitis right eye, unspecified eyelid: Secondary | ICD-10-CM | POA: Diagnosis not present

## 2019-08-07 ENCOUNTER — Encounter: Payer: Self-pay | Admitting: Obstetrics and Gynecology

## 2019-10-17 ENCOUNTER — Telehealth: Payer: Self-pay

## 2019-10-17 NOTE — Telephone Encounter (Signed)
LEFT MESSAGE FOR PATIENT TO CONFIRM AND GO OVER SCREENING QUESTIONS FOR 11-16 APPOINTMENT. °

## 2019-10-21 ENCOUNTER — Other Ambulatory Visit: Payer: Self-pay

## 2019-10-21 ENCOUNTER — Encounter: Payer: Self-pay | Admitting: Nurse Practitioner

## 2019-10-21 ENCOUNTER — Ambulatory Visit (INDEPENDENT_AMBULATORY_CARE_PROVIDER_SITE_OTHER): Payer: PPO | Admitting: Nurse Practitioner

## 2019-10-21 VITALS — BP 140/70 | HR 78 | Temp 97.2°F | Resp 16 | Ht 62.0 in | Wt 110.0 lb

## 2019-10-21 DIAGNOSIS — E782 Mixed hyperlipidemia: Secondary | ICD-10-CM | POA: Insufficient documentation

## 2019-10-21 DIAGNOSIS — E039 Hypothyroidism, unspecified: Secondary | ICD-10-CM

## 2019-10-21 NOTE — Progress Notes (Signed)
[facility] [address] [place], [place] [address]  Internal MEDICINE  Office Visit Note  Patient Name: Tiffany Bruce  [id number]  [id number]  Date of Service: 10/21/2019  Chief Complaint  Patient presents with  . Hypothyroidism    The patient is here for follow up visit. Her blood pressure is well controlled. Reviewed las. Thyroid panel, showing TSH at 0.046, however, Free T4 is normal. Cholesterol panel is mildly elevated. Discussed heart healthy diet to reduce cholesterol. She states that she feels good. Has been mildly stressed. She recently had care totaled. She is taking care of her husband who has moderate, progressive dementia.       Current Medication: Outpatient Encounter Medications as of 10/21/2019  Medication Sig  . Ascorbic Acid (VITAMIN C) 100 MG tablet Take 100 mg by mouth daily.  Marland Kitchen. azelastine (ASTELIN) 0.1 % nasal spray 2 sprays.  . calcium-vitamin D (OSCAL WITH D) 250-125 MG-UNIT per tablet Take 1 tablet by mouth daily.  Marland Kitchen. docusate sodium (COLACE) 100 MG capsule Take 100 mg by mouth 2 (two) times daily.  . fluticasone (FLONASE) 50 MCG/ACT nasal spray 2 sprays.  Marland Kitchen. levothyroxine (SYNTHROID) 75 MCG tablet Take 1 tablet (75 mcg total) by mouth daily before breakfast.  . vitamin A 2956210000 UNIT capsule Take 10,000 Units by mouth daily.  . vitamin B-12 (CYANOCOBALAMIN) 100 MCG tablet Take 100 mcg by mouth daily.  . vitamin E 100 UNIT capsule Take by mouth daily.   No facility-administered encounter medications on file as of 10/21/2019.     Surgical History: Past Surgical History:  Procedure Laterality Date  . ABDOMINAL HYSTERECTOMY    . ANTERIOR AND POSTERIOR VAGINAL REPAIR  12/15/2014,2010  . ENTEROCELE REPAIR    . TONSILLECTOMY    . TUBAL LIGATION      Medical History: Past Medical History:  Diagnosis Date  . Abnormal heart rhythm   . Cervical lesion   . Hypothyroidism   . Osteoporosis   . Vaginal atrophy   . Vaginal atrophy      Family History: Family History  Problem Relation Age of Onset  . Cancer Neg Hx   . Diabetes Neg Hx   . Heart disease Neg Hx     Social History   Socioeconomic History  . Marital status: Married    Spouse name: Not on file  . Number of children: Not on file  . Years of education: Not on file  . Highest education level: Not on file  Occupational History  . Not on file  Social Needs  . Financial resource strain: Not on file  . Food insecurity    Worry: Not on file    Inability: Not on file  . Transportation needs    Medical: Not on file    Non-medical: Not on file  Tobacco Use  . Smoking status: Never Smoker  . Smokeless tobacco: Never Used  Substance and Sexual Activity  . Alcohol use: No  . Drug use: No  . Sexual activity: Not Currently    Birth control/protection: Surgical  Lifestyle  . Physical activity    Days per week: Not on file    Minutes per session: Not on file  . Stress: Not on file  Relationships  . Social Musicianconnections    Talks on phone: Not on file    Gets together: Not on file    Attends religious service: Not on file    Active member of club or organization: Not on file  Attends meetings of clubs or organizations: Not on file    Relationship status: Not on file  . Intimate partner violence    Fear of current or ex partner: Not on file    Emotionally abused: Not on file    Physically abused: Not on file    Forced sexual activity: Not on file  Other Topics Concern  . Not on file  Social History Narrative  . Not on file      Review of Systems  Constitutional: Negative for activity change, chills, fatigue and unexpected weight change.  HENT: Negative for congestion, postnasal drip, rhinorrhea, sneezing and sore throat.   Respiratory: Negative for cough, chest tightness and shortness of breath.   Cardiovascular: Negative for chest pain and palpitations.  Gastrointestinal: Negative for abdominal pain, constipation, diarrhea, nausea and  vomiting.  Endocrine: Negative for cold intolerance, heat intolerance, polydipsia and polyuria.       Most recent TSH 0.046 with normal Free T4  Genitourinary: Negative for dysuria and frequency.  Musculoskeletal: Negative for arthralgias, back pain, joint swelling and neck pain.  Skin: Negative for rash.  Allergic/Immunologic: Negative for environmental allergies.  Neurological: Negative for dizziness, tremors, numbness and headaches.  Hematological: Negative for adenopathy. Does not bruise/bleed easily.  Psychiatric/Behavioral: Negative for behavioral problems (Depression), sleep disturbance and suicidal ideas. The patient is nervous/anxious.     Today's Vitals   10/21/19 0844  BP: 140/70  Pulse: 78  Resp: 16  Temp: (!) 97.2 F (36.2 C)  SpO2: 98%  Weight: 110 lb (49.9 kg)  Height: 5\' 2"  (1.575 m)   Body mass index is 20.12 kg/m.  Physical Exam Vitals signs and nursing note reviewed.  Constitutional:      General: She is not in acute distress.    Appearance: She is well-developed. She is not diaphoretic.  HENT:     Head: Normocephalic and atraumatic.     Mouth/Throat:     Pharynx: No oropharyngeal exudate.  Eyes:     Pupils: Pupils are equal, round, and reactive to light.  Neck:     Musculoskeletal: Normal range of motion and neck supple.     Thyroid: No thyromegaly.     Vascular: No carotid bruit or JVD.     Trachea: No tracheal deviation.  Cardiovascular:     Rate and Rhythm: Normal rate and regular rhythm.     Heart sounds: Normal heart sounds. No murmur. No friction rub. No gallop.   Pulmonary:     Effort: Pulmonary effort is normal. No respiratory distress.     Breath sounds: Normal breath sounds. No wheezing or rales.  Chest:     Chest wall: No tenderness.  Abdominal:     General: Bowel sounds are normal.     Palpations: Abdomen is soft.     Tenderness: There is no abdominal tenderness.  Musculoskeletal: Normal range of motion.  Lymphadenopathy:      Cervical: No cervical adenopathy.  Skin:    General: Skin is warm and dry.  Neurological:     Mental Status: She is alert and oriented to person, place, and time.     Cranial Nerves: No cranial nerve deficit.     Deep Tendon Reflexes: Reflexes normal.  Psychiatric:        Behavior: Behavior normal.        Thought Content: Thought content normal.        Judgment: Judgment normal.   Assessment/Plan: 1. Acquired hypothyroidism Most recent lab check showing TSH 0.046  with normal Free T4. Will recheck and adjust levothyroxine as indicated   2. Mixed hyperlipidemia Reviewed labs. Mild, generalized elevation of lipid panel. Discussed heart healthy diet to help reduce cholesterol panel.   General Counseling: lakitha gordy understanding of the findings of todays visit and agrees with plan of treatment. I have discussed any further diagnostic evaluation that may be needed or ordered today. We also reviewed her medications today. she has been encouraged to call the office with any questions or concerns that should arise related to todays visit.    No orders of the defined types were placed in this encounter.   No orders of the defined types were placed in this encounter.   Time spent:15 Minutes      Dr Lavera Guise Internal medicine

## 2019-11-21 ENCOUNTER — Other Ambulatory Visit: Payer: Self-pay | Admitting: Nurse Practitioner

## 2019-11-21 DIAGNOSIS — E039 Hypothyroidism, unspecified: Secondary | ICD-10-CM | POA: Diagnosis not present

## 2019-11-22 LAB — T4, FREE: Free T4: 1.55 ng/dL (ref 0.82–1.77)

## 2019-11-22 LAB — TSH: TSH: 0.067 u[IU]/mL — ABNORMAL LOW (ref 0.450–4.500)

## 2019-11-27 NOTE — Progress Notes (Signed)
Please let the patient know that recent thyroid panel looks good. Continue levothyroxine at current dose. Thanks.

## 2019-12-02 NOTE — Progress Notes (Signed)
Lmom to call us back 

## 2019-12-03 NOTE — Progress Notes (Signed)
Pt advised thyroid panel looks good continue same med

## 2020-04-20 ENCOUNTER — Ambulatory Visit: Payer: PPO | Admitting: Nurse Practitioner

## 2020-04-22 ENCOUNTER — Other Ambulatory Visit: Payer: Self-pay | Admitting: Nurse Practitioner

## 2020-04-22 DIAGNOSIS — Z1231 Encounter for screening mammogram for malignant neoplasm of breast: Secondary | ICD-10-CM

## 2020-04-28 ENCOUNTER — Telehealth: Payer: Self-pay

## 2020-04-28 NOTE — Telephone Encounter (Signed)
Vm full was unable to leave message for 04-30-20 ov reminder.

## 2020-04-30 ENCOUNTER — Other Ambulatory Visit: Payer: Self-pay

## 2020-04-30 ENCOUNTER — Ambulatory Visit (INDEPENDENT_AMBULATORY_CARE_PROVIDER_SITE_OTHER): Payer: PPO | Admitting: Nurse Practitioner

## 2020-04-30 ENCOUNTER — Encounter: Payer: Self-pay | Admitting: Nurse Practitioner

## 2020-04-30 VITALS — BP 148/60 | HR 72 | Temp 97.2°F | Resp 16 | Ht 62.0 in | Wt 113.8 lb

## 2020-04-30 DIAGNOSIS — E039 Hypothyroidism, unspecified: Secondary | ICD-10-CM | POA: Diagnosis not present

## 2020-04-30 DIAGNOSIS — R030 Elevated blood-pressure reading, without diagnosis of hypertension: Secondary | ICD-10-CM

## 2020-04-30 DIAGNOSIS — R300 Dysuria: Secondary | ICD-10-CM | POA: Diagnosis not present

## 2020-04-30 DIAGNOSIS — Z0001 Encounter for general adult medical examination with abnormal findings: Secondary | ICD-10-CM | POA: Diagnosis not present

## 2020-04-30 MED ORDER — LEVOTHYROXINE SODIUM 75 MCG PO TABS: 75.0000 ug | ORAL_TABLET | Freq: Every day | ORAL | 3 refills | Status: DC

## 2020-04-30 NOTE — Progress Notes (Signed)
[facility] [address] [place], [place] [address]  Internal MEDICINE  Office Visit Note  Patient Name: Tiffany Bruce  [id number]  [id number]  Date of Service: 05/04/2020   Pt is here for routine health maintenance examination    Chief Complaint  Patient presents with  . Medicare Wellness  . Hypothyroidism  . Osteoporosis     The patient is here for health maintenance exam. Blood pressure is a little elevated. This is normal for her at beginning of visit. Will generally get better as visit goes on. She is feeling well. Her husband passed away in 2019-12-29. She is trying to get things settled after his passing. The patient states that she continues to work part time at [employer]. She has travelled to [place] to visit her daughter. She has also started going to [employer] to get regular exercise. She does not have concerns or complaints today.   Current Medication: Outpatient Encounter Medications as of 04/30/2020  Medication Sig  . Ascorbic Acid (VITAMIN C) 100 MG tablet Take 100 mg by mouth daily.  Marland Kitchen azelastine (ASTELIN) 0.1 % nasal spray 2 sprays.  . calcium-vitamin D (OSCAL WITH D) 250-125 MG-UNIT per tablet Take 1 tablet by mouth daily.  Marland Kitchen docusate sodium (COLACE) 100 MG capsule Take 100 mg by mouth 2 (two) times daily.  . fluticasone (FLONASE) 50 MCG/ACT nasal spray 2 sprays.  Marland Kitchen levothyroxine (SYNTHROID) 75 MCG tablet Take 1 tablet (75 mcg total) by mouth daily before breakfast.  . vitamin A 31517 UNIT capsule Take 10,000 Units by mouth daily.  . vitamin B-12 (CYANOCOBALAMIN) 100 MCG tablet Take 100 mcg by mouth daily.  . vitamin E 100 UNIT capsule Take by mouth daily.  . [DISCONTINUED] levothyroxine (SYNTHROID) 75 MCG tablet Take 1 tablet (75 mcg total) by mouth daily before breakfast.   No facility-administered encounter medications on file as of 04/30/2020.    Surgical History: Past Surgical History:  Procedure Laterality Date  .  ABDOMINAL HYSTERECTOMY    . ANTERIOR AND POSTERIOR VAGINAL REPAIR  12/15/2014,2010  . ENTEROCELE REPAIR    . TONSILLECTOMY    . TUBAL LIGATION      Medical History: Past Medical History:  Diagnosis Date  . Abnormal heart rhythm   . Cervical lesion   . Hypothyroidism   . Osteoporosis   . Vaginal atrophy   . Vaginal atrophy     Family History: Family History  Problem Relation Age of Onset  . Cancer Neg Hx   . Diabetes Neg Hx   . Heart disease Neg Hx       Review of Systems  Constitutional: Negative for activity change, chills, fatigue and unexpected weight change.  HENT: Negative for congestion, postnasal drip, rhinorrhea, sneezing and sore throat.   Respiratory: Negative for cough, chest tightness, shortness of breath and wheezing.   Cardiovascular: Negative for chest pain and palpitations.       Mildly elevated blood pressure today.   Gastrointestinal: Negative for abdominal pain, constipation, diarrhea, nausea and vomiting.  Endocrine: Negative for cold intolerance, heat intolerance, polydipsia and polyuria.  Genitourinary: Negative for dysuria, frequency and urgency.  Musculoskeletal: Negative for arthralgias, back pain, joint swelling and neck pain.  Skin: Negative for rash.  Allergic/Immunologic: Negative for environmental allergies.  Neurological: Negative for dizziness, tremors, numbness and headaches.  Hematological: Negative for adenopathy. Does not bruise/bleed easily.  Psychiatric/Behavioral: Negative for behavioral problems (Depression), sleep disturbance and suicidal ideas. The patient is not nervous/anxious.  Today's Vitals   04/30/20 1504  BP: (!) 148/60  Pulse: 72  Resp: 16  Temp: (!) 97.2 F (36.2 C)  SpO2: 97%  Weight: 113 lb 12.8 oz (51.6 kg)  Height: 5\' 2"  (1.575 m)   Body mass index is 20.81 kg/m.  Physical Exam Vitals and nursing note reviewed.  Constitutional:      General: She is not in acute distress.    Appearance: Normal  appearance. She is well-developed. She is not diaphoretic.  HENT:     Head: Normocephalic and atraumatic.     Nose: Nose normal.     Mouth/Throat:     Pharynx: No oropharyngeal exudate.  Eyes:     Conjunctiva/sclera: Conjunctivae normal.     Pupils: Pupils are equal, round, and reactive to light.  Neck:     Thyroid: No thyromegaly.     Vascular: No JVD.     Trachea: No tracheal deviation.  Cardiovascular:     Rate and Rhythm: Normal rate and regular rhythm.     Pulses: Normal pulses.     Heart sounds: Normal heart sounds. No murmur. No friction rub. No gallop.   Pulmonary:     Effort: Pulmonary effort is normal. No respiratory distress.     Breath sounds: Normal breath sounds. No wheezing or rales.  Chest:     Chest wall: No tenderness.     Breasts:        Right: Normal. No swelling, bleeding, inverted nipple, mass, nipple discharge, skin change or tenderness.        Left: Normal. No swelling, bleeding, inverted nipple, mass, nipple discharge, skin change or tenderness.  Abdominal:     General: Bowel sounds are normal.     Palpations: Abdomen is soft.     Tenderness: There is no abdominal tenderness.  Musculoskeletal:        General: Normal range of motion.     Cervical back: Normal range of motion and neck supple.  Lymphadenopathy:     Cervical: No cervical adenopathy.     Upper Body:     Right upper body: No axillary adenopathy.     Left upper body: No axillary adenopathy.  Skin:    General: Skin is warm and dry.  Neurological:     General: No focal deficit present.     Mental Status: She is alert and oriented to person, place, and time.     Cranial Nerves: No cranial nerve deficit.  Psychiatric:        Mood and Affect: Mood normal.        Behavior: Behavior normal.        Thought Content: Thought content normal.        Judgment: Judgment normal.    Depression screen [facility] 2/9 04/30/2020 10/21/2019 04/19/2019 10/19/2018 04/16/2018  Decreased Interest 0 0 0 0 0  Down,  Depressed, Hopeless 0 0 0 0 0  PHQ - 2 Score 0 0 0 0 0    Functional Status Survey: Is the patient deaf or have difficulty hearing?: Yes Does the patient have difficulty seeing, even when wearing glasses/contacts?: No Does the patient have difficulty concentrating, remembering, or making decisions?: No Does the patient have difficulty walking or climbing stairs?: No Does the patient have difficulty dressing or bathing?: No Does the patient have difficulty doing errands alone such as visiting a doctor's office or shopping?: No  MMSE - Mini Mental State Exam 04/30/2020 04/19/2019 04/16/2018  Orientation to time 5 5 5   Orientation  to Place 5 5 5   Registration 3 3 3   Attention/ Calculation 5 5 3   Recall 3 3 3   Language- name 2 objects 2 2 2   Language- repeat 1 1 1   Language- follow 3 step command 3 3 3   Language- read & follow direction 1 1 1   Write a sentence 1 1 1   Copy design 1 1 1   Total score 30 30 28     Fall Risk  04/30/2020 10/21/2019 04/19/2019 04/19/2019 10/19/2018  Falls in the past year? 0 0 0 0 0      LABS: Recent Results (from the past 2160 hour(s))  Urinalysis, Routine w reflex microscopic     Status: Abnormal   Collection Time: 04/30/20  3:06 PM  Result Value Ref Range   Specific Gravity, UA 1.022 1.005 - 1.030   pH, UA 5.0 5.0 - 7.5   Color, UA Yellow Yellow   Appearance Ur Clear Clear   Leukocytes,UA 2+ (A) Negative   Protein,UA Negative Negative/Trace   Glucose, UA Negative Negative   Ketones, UA Negative Negative   RBC, UA Negative Negative   Bilirubin, UA Negative Negative   Urobilinogen, Ur 0.2 0.2 - 1.0 mg/dL   Nitrite, UA Negative Negative   Microscopic Examination See below:     Comment: Microscopic was indicated and was performed.  Microscopic Examination     Status: Abnormal   Collection Time: 04/30/20  3:06 PM   URINE  Result Value Ref Range   WBC, UA 11-30 (A) 0 - 5 /hpf   RBC 0-2 0 - 2 /hpf   Epithelial Cells (non renal) 0-10 0 - 10 /hpf    Casts None seen None seen /lpf   Bacteria, UA None seen None seen/Few    Assessment/Plan: 1. Encounter for general adult medical examination with abnormal findings Annual health maintenance exam today. Routine, fasting labs ordered.   2. Elevated blood-pressure reading without diagnosis of hypertension Well controlled without medication. Will continue to monitor closely.   3. Acquired hypothyroidism Recheck thyroid panel with routine labs. Adjust dose levothyroxine as indicated.   - levothyroxine (SYNTHROID) 75 MCG tablet; Take 1 tablet (75 mcg total) by mouth daily before breakfast.  Dispense: 90 tablet; Refill: 3  4. Dysuria - Urinalysis, Routine w reflex microscopic  General Counseling: Yasira verbalizes understanding of the findings of todays visit and agrees with plan of treatment. I have discussed any further diagnostic evaluation that may be needed or ordered today. We also reviewed her medications today. she has been encouraged to call the office with any questions or concerns that should arise related to todays visit.    Counseling:  This patient was seen by FNP Collaboration with Dr as a part of collaborative care agreement  Orders Placed This Encounter  Procedures  . Microscopic Examination  . Urinalysis, Routine w reflex microscopic    Meds ordered this encounter  Medications  . levothyroxine (SYNTHROID) 75 MCG tablet    Sig: Take 1 tablet (75 mcg total) by mouth daily before breakfast.    Dispense:  90 tablet    Refill:  3    Order Specific Question:   Supervising Provider    Answer:   05/02/2020 [[id number]]    Total time spent: 45 Minutes  Time spent includes review of chart, medications, test results, and follow up plan with the patient.     10/23/2019, MD  Internal Medicine

## 2020-05-01 LAB — MICROSCOPIC EXAMINATION
Bacteria, UA: NONE SEEN
Casts: NONE SEEN /LPF

## 2020-05-01 LAB — URINALYSIS, ROUTINE W REFLEX MICROSCOPIC
Bilirubin, UA: NEGATIVE
Glucose, UA: NEGATIVE
Ketones, UA: NEGATIVE
Nitrite, UA: NEGATIVE
Protein,UA: NEGATIVE
RBC, UA: NEGATIVE
Specific Gravity, UA: 1.022 (ref 1.005–1.030)
Urobilinogen, Ur: 0.2 mg/dL (ref 0.2–1.0)
pH, UA: 5 (ref 5.0–7.5)

## 2020-05-15 ENCOUNTER — Other Ambulatory Visit: Payer: Self-pay | Admitting: Nurse Practitioner

## 2020-05-15 DIAGNOSIS — I10 Essential (primary) hypertension: Secondary | ICD-10-CM | POA: Diagnosis not present

## 2020-05-15 DIAGNOSIS — E039 Hypothyroidism, unspecified: Secondary | ICD-10-CM | POA: Diagnosis not present

## 2020-05-15 DIAGNOSIS — E559 Vitamin D deficiency, unspecified: Secondary | ICD-10-CM | POA: Diagnosis not present

## 2020-05-15 DIAGNOSIS — Z0001 Encounter for general adult medical examination with abnormal findings: Secondary | ICD-10-CM | POA: Diagnosis not present

## 2020-05-16 LAB — COMPREHENSIVE METABOLIC PANEL
ALT: 17 IU/L (ref 0–32)
AST: 23 IU/L (ref 0–40)
Albumin/Globulin Ratio: 1.8 (ref 1.2–2.2)
Albumin: 4.1 g/dL (ref 3.6–4.6)
Alkaline Phosphatase: 108 IU/L (ref 48–121)
BUN/Creatinine Ratio: 21 (ref 12–28)
BUN: 14 mg/dL (ref 8–27)
Bilirubin Total: 0.4 mg/dL (ref 0.0–1.2)
CO2: 24 mmol/L (ref 20–29)
Calcium: 9.7 mg/dL (ref 8.7–10.3)
Chloride: 102 mmol/L (ref 96–106)
Creatinine, Ser: 0.68 mg/dL (ref 0.57–1.00)
GFR calc Af Amer: 94 mL/min/{1.73_m2} (ref >59)
GFR calc non Af Amer: 81 mL/min/{1.73_m2} (ref >59)
Globulin, Total: 2.3 g/dL (ref 1.5–4.5)
Glucose: 108 mg/dL — ABNORMAL HIGH (ref 65–99)
Potassium: 4.6 mmol/L (ref 3.5–5.2)
Sodium: 142 mmol/L (ref 134–144)
Total Protein: 6.4 g/dL (ref 6.0–8.5)

## 2020-05-16 LAB — CBC
Hematocrit: 40 % (ref 34.0–46.6)
Hemoglobin: 13.5 g/dL (ref 11.1–15.9)
MCH: 30.5 pg (ref 26.6–33.0)
MCHC: 33.8 g/dL (ref 31.5–35.7)
MCV: 91 fL (ref 79–97)
Platelets: 290 10*3/uL (ref 150–450)
RBC: 4.42 x10E6/uL (ref 3.77–5.28)
RDW: 12.8 % (ref 11.7–15.4)
WBC: 5.7 10*3/uL (ref 3.4–10.8)

## 2020-05-16 LAB — LIPID PANEL WITH LDL/HDL RATIO
Cholesterol, Total: 233 mg/dL — ABNORMAL HIGH (ref 100–199)
HDL: 57 mg/dL (ref >39)
LDL Chol Calc (NIH): 156 mg/dL — ABNORMAL HIGH (ref 0–99)
LDL/HDL Ratio: 2.7 ratio (ref 0.0–3.2)
Triglycerides: 111 mg/dL (ref 0–149)
VLDL Cholesterol Cal: 20 mg/dL (ref 5–40)

## 2020-05-16 LAB — T4, FREE: Free T4: 1.64 ng/dL (ref 0.82–1.77)

## 2020-05-16 LAB — TSH: TSH: 0.096 u[IU]/mL — ABNORMAL LOW (ref 0.450–4.500)

## 2020-05-16 LAB — VITAMIN D 25 HYDROXY (VIT D DEFICIENCY, FRACTURES): Vit D, 25-Hydroxy: 67.1 ng/mL (ref 30.0–100.0)

## 2020-05-26 ENCOUNTER — Ambulatory Visit
Admission: RE | Admit: 2020-05-26 | Discharge: 2020-05-26 | Disposition: A | Discharge status: Home / Self Care | Payer: PPO | Source: Ambulatory Visit | Attending: Nurse Practitioner | Admitting: Nurse Practitioner

## 2020-05-26 DIAGNOSIS — Z1231 Encounter for screening mammogram for malignant neoplasm of breast: Secondary | ICD-10-CM

## 2020-06-03 NOTE — Progress Notes (Signed)
Overall labs good. TSH 0.096 with normal free T4. Will continue to monitor.

## 2020-06-03 NOTE — Progress Notes (Signed)
Negative mammogram

## 2020-06-29 DIAGNOSIS — M531 Cervicobrachial syndrome: Secondary | ICD-10-CM | POA: Diagnosis not present

## 2020-06-29 DIAGNOSIS — M9902 Segmental and somatic dysfunction of thoracic region: Secondary | ICD-10-CM | POA: Diagnosis not present

## 2020-06-29 DIAGNOSIS — M9903 Segmental and somatic dysfunction of lumbar region: Secondary | ICD-10-CM | POA: Diagnosis not present

## 2020-06-29 DIAGNOSIS — M9904 Segmental and somatic dysfunction of sacral region: Secondary | ICD-10-CM | POA: Diagnosis not present

## 2020-06-29 DIAGNOSIS — M4606 Spinal enthesopathy, lumbar region: Secondary | ICD-10-CM | POA: Diagnosis not present

## 2020-06-29 DIAGNOSIS — M9901 Segmental and somatic dysfunction of cervical region: Secondary | ICD-10-CM | POA: Diagnosis not present

## 2020-07-06 DIAGNOSIS — M531 Cervicobrachial syndrome: Secondary | ICD-10-CM | POA: Diagnosis not present

## 2020-07-06 DIAGNOSIS — M4606 Spinal enthesopathy, lumbar region: Secondary | ICD-10-CM | POA: Diagnosis not present

## 2020-07-06 DIAGNOSIS — M9904 Segmental and somatic dysfunction of sacral region: Secondary | ICD-10-CM | POA: Diagnosis not present

## 2020-07-06 DIAGNOSIS — M9902 Segmental and somatic dysfunction of thoracic region: Secondary | ICD-10-CM | POA: Diagnosis not present

## 2020-07-06 DIAGNOSIS — M9903 Segmental and somatic dysfunction of lumbar region: Secondary | ICD-10-CM | POA: Diagnosis not present

## 2020-07-06 DIAGNOSIS — M9901 Segmental and somatic dysfunction of cervical region: Secondary | ICD-10-CM | POA: Diagnosis not present

## 2020-07-13 DIAGNOSIS — M4606 Spinal enthesopathy, lumbar region: Secondary | ICD-10-CM | POA: Diagnosis not present

## 2020-07-13 DIAGNOSIS — M9902 Segmental and somatic dysfunction of thoracic region: Secondary | ICD-10-CM | POA: Diagnosis not present

## 2020-07-13 DIAGNOSIS — M9904 Segmental and somatic dysfunction of sacral region: Secondary | ICD-10-CM | POA: Diagnosis not present

## 2020-07-13 DIAGNOSIS — M9901 Segmental and somatic dysfunction of cervical region: Secondary | ICD-10-CM | POA: Diagnosis not present

## 2020-07-13 DIAGNOSIS — M531 Cervicobrachial syndrome: Secondary | ICD-10-CM | POA: Diagnosis not present

## 2020-07-13 DIAGNOSIS — M9903 Segmental and somatic dysfunction of lumbar region: Secondary | ICD-10-CM | POA: Diagnosis not present

## 2020-08-03 DIAGNOSIS — H04123 Dry eye syndrome of bilateral lacrimal glands: Secondary | ICD-10-CM | POA: Diagnosis not present

## 2020-08-03 DIAGNOSIS — H353131 Nonexudative age-related macular degeneration, bilateral, early dry stage: Secondary | ICD-10-CM | POA: Diagnosis not present

## 2020-08-03 DIAGNOSIS — H2513 Age-related nuclear cataract, bilateral: Secondary | ICD-10-CM | POA: Diagnosis not present

## 2020-10-26 ENCOUNTER — Encounter: Payer: Self-pay | Admitting: Nurse Practitioner

## 2020-10-26 ENCOUNTER — Ambulatory Visit (INDEPENDENT_AMBULATORY_CARE_PROVIDER_SITE_OTHER): Payer: PPO | Admitting: Nurse Practitioner

## 2020-10-26 VITALS — BP 138/62 | HR 76 | Temp 98.4°F | Resp 16 | Ht 62.0 in | Wt 111.2 lb

## 2020-10-26 DIAGNOSIS — L03011 Cellulitis of right finger: Secondary | ICD-10-CM | POA: Diagnosis not present

## 2020-10-26 DIAGNOSIS — E039 Hypothyroidism, unspecified: Secondary | ICD-10-CM | POA: Diagnosis not present

## 2020-10-26 MED ORDER — MUPIROCIN 2 % EX OINT: TOPICAL_OINTMENT | CUTANEOUS | 1 refills | Status: DC

## 2020-10-26 NOTE — Progress Notes (Signed)
[facility] [address] [place], [place] [address]  Internal MEDICINE  Office Visit Note  Patient Name: Tiffany Bruce  [id number]  [id number]  Date of Service: 10/26/2020  Chief Complaint  Patient presents with  . Follow-up  . Quality Metric Gaps    flu  . controlled substance form    reviewed with PT    The patient is here for follow up visit. She states that she feels well. She states that she stays busy all the time. She still works part time. She states that she did something when she "mashed" her right middle finger about a month ago. States that it is still slightly swollen and tender. Has not healed like she expected it to.  She has already received her flu shot and her COVID 19 booster shot.       Current Medication: Outpatient Encounter Medications as of 10/26/2020  Medication Sig  . Ascorbic Acid (VITAMIN C) 100 MG tablet Take 100 mg by mouth daily.  Marland Kitchen azelastine (ASTELIN) 0.1 % nasal spray 2 sprays.  . calcium-vitamin D (OSCAL WITH D) 250-125 MG-UNIT per tablet Take 1 tablet by mouth daily.  Marland Kitchen docusate sodium (COLACE) 100 MG capsule Take 100 mg by mouth 2 (two) times daily.  . fluticasone (FLONASE) 50 MCG/ACT nasal spray 2 sprays.  Marland Kitchen levothyroxine (SYNTHROID) 75 MCG tablet Take 1 tablet (75 mcg total) by mouth daily before breakfast.  . vitamin A 44034 UNIT capsule Take 10,000 Units by mouth daily.  . vitamin B-12 (CYANOCOBALAMIN) 100 MCG tablet Take 100 mcg by mouth daily.  . vitamin E 100 UNIT capsule Take by mouth daily.  . mupirocin ointment (BACTROBAN) 2 % Apply small amount to effected areas BID   No facility-administered encounter medications on file as of 10/26/2020.    Surgical History: Past Surgical History:  Procedure Laterality Date  . ABDOMINAL HYSTERECTOMY    . ANTERIOR AND POSTERIOR VAGINAL REPAIR  12/15/2014,2010  . ENTEROCELE REPAIR    . TONSILLECTOMY    . TUBAL LIGATION      Medical History: Past Medical History:   Diagnosis Date  . Abnormal heart rhythm   . Cervical lesion   . Hypothyroidism   . Osteoporosis   . Vaginal atrophy   . Vaginal atrophy     Family History: Family History  Problem Relation Age of Onset  . Cancer Neg Hx   . Diabetes Neg Hx   . Heart disease Neg Hx   . Breast cancer Neg Hx     Social History   Socioeconomic History  . Marital status: Married    Spouse name: Not on file  . Number of children: Not on file  . Years of education: Not on file  . Highest education level: Not on file  Occupational History  . Not on file  Tobacco Use  . Smoking status: Never Smoker  . Smokeless tobacco: Never Used  Vaping Use  . Vaping Use: Never used  Substance and Sexual Activity  . Alcohol use: No  . Drug use: No  . Sexual activity: Not Currently    Birth control/protection: Surgical  Other Topics Concern  . Not on file  Social History Narrative  . Not on file   Social Determinants of Health   Financial Resource Strain:   . Difficulty of Paying Living Expenses: Not on file  Food Insecurity:   . Worried About Programme researcher, broadcasting/film/video in the Last Year: Not on file  . Ran Out  of Food in the Last Year: Not on file  Transportation Needs:   . Lack of Transportation (Medical): Not on file  . Lack of Transportation (Non-Medical): Not on file  Physical Activity:   . Days of Exercise per Week: Not on file  . Minutes of Exercise per Session: Not on file  Stress:   . Feeling of Stress : Not on file  Social Connections:   . Frequency of Communication with Friends and Family: Not on file  . Frequency of Social Gatherings with Friends and Family: Not on file  . Attends Religious Services: Not on file  . Active Member of Clubs or Organizations: Not on file  . Attends Banker Meetings: Not on file  . Marital Status: Not on file  Intimate Partner Violence:   . Fear of Current or Ex-Partner: Not on file  . Emotionally Abused: Not on file  . Physically Abused: Not  on file  . Sexually Abused: Not on file      Review of Systems  Constitutional: Negative for chills, fatigue and unexpected weight change.  HENT: Positive for postnasal drip. Negative for congestion, rhinorrhea, sneezing and sore throat.   Respiratory: Negative for cough, chest tightness and shortness of breath.   Cardiovascular: Negative for chest pain and palpitations.  Gastrointestinal: Negative for abdominal pain, constipation, diarrhea, nausea and vomiting.  Endocrine: Negative for cold intolerance, heat intolerance, polydipsia and polyuria.  Musculoskeletal: Positive for arthralgias. Negative for back pain, joint swelling and neck pain.       Tenderness of middle finger of right hand.   Skin: Negative for rash.       There is inflammation and swelling of the nail bed of the middle finger of left hand.   Allergic/Immunologic: Negative for environmental allergies.  Neurological: Negative for dizziness, tremors, numbness and headaches.  Hematological: Negative for adenopathy. Does not bruise/bleed easily.  Psychiatric/Behavioral: Negative for behavioral problems (Depression), sleep disturbance and suicidal ideas. The patient is not nervous/anxious.    Today's Vitals   10/26/20 0835  BP: 138/62  Pulse: 76  Resp: 16  Temp: 98.4 F (36.9 C)  SpO2: 93%  Weight: 111 lb 3.2 oz (50.4 kg)  Height: 5\' 2"  (1.575 m)   Body mass index is 20.34 kg/m.  Physical Exam Vitals and nursing note reviewed.  Constitutional:      General: She is not in acute distress.    Appearance: Normal appearance. She is well-developed. She is not diaphoretic.  HENT:     Head: Normocephalic and atraumatic.     Mouth/Throat:     Pharynx: No oropharyngeal exudate.  Eyes:     Pupils: Pupils are equal, round, and reactive to light.  Neck:     Thyroid: No thyromegaly.     Vascular: No carotid bruit or JVD.     Trachea: No tracheal deviation.  Cardiovascular:     Rate and Rhythm: Normal rate and regular  rhythm.     Heart sounds: Normal heart sounds. No murmur heard.  No friction rub. No gallop.   Pulmonary:     Effort: Pulmonary effort is normal. No respiratory distress.     Breath sounds: Normal breath sounds. No wheezing or rales.  Chest:     Chest wall: No tenderness.  Abdominal:     Palpations: Abdomen is soft.  Musculoskeletal:        General: Normal range of motion.     Cervical back: Normal range of motion and neck supple.  Lymphadenopathy:  Cervical: No cervical adenopathy.  Skin:    General: Skin is warm and dry.     Findings: Erythema present.     Comments: There is erythema and warmth with some swelling of the nailbed of the middle finger of right hand. Fingernail just above this is damaged but not tender or inflamed. No discreet lesion or drainage noted at this time.   Neurological:     Mental Status: She is alert and oriented to person, place, and time.     Cranial Nerves: No cranial nerve deficit.  Psychiatric:        Mood and Affect: Mood normal.        Behavior: Behavior normal.        Thought Content: Thought content normal.        Judgment: Judgment normal.    Assessment/Plan: 1. Acquired hypothyroidism Thyroid panel stable. Continue levothyroxine as prescribed   2. Cellulitis of finger of right hand Add bactroban ointment twice daily to effected area.  - mupirocin ointment (BACTROBAN) 2 %; Apply small amount to effected areas BID  Dispense: 22 g; Refill: 1  General Counseling: Kaetlin verbalizes understanding of the findings of todays visit and agrees with plan of treatment. I have discussed any further diagnostic evaluation that may be needed or ordered today. We also reviewed her medications today. she has been encouraged to call the office with any questions or concerns that should arise related to todays visit.   This patient was seen by Vincent Gros FNP Collaboration with Dr Lyndon Code as a part of collaborative care agreement  Meds ordered this  encounter  Medications  . mupirocin ointment (BACTROBAN) 2 %    Sig: Apply small amount to effected areas BID    Dispense:  22 g    Refill:  1    Order Specific Question:   Supervising Provider    Answer:   Lyndon Code [[id number]]    Total time spent: 25 Minutes   Time spent includes review of chart, medications, test results, and follow up plan with the patient.      Dr Lyndon Code Internal medicine

## 2021-03-09 DIAGNOSIS — H903 Sensorineural hearing loss, bilateral: Secondary | ICD-10-CM | POA: Diagnosis not present

## 2021-03-09 DIAGNOSIS — H6123 Impacted cerumen, bilateral: Secondary | ICD-10-CM | POA: Diagnosis not present

## 2021-04-21 ENCOUNTER — Other Ambulatory Visit: Payer: Self-pay | Admitting: Nurse Practitioner

## 2021-04-21 DIAGNOSIS — Z1239 Encounter for other screening for malignant neoplasm of breast: Secondary | ICD-10-CM

## 2021-05-06 ENCOUNTER — Telehealth: Payer: Self-pay

## 2021-05-06 NOTE — Telephone Encounter (Signed)
Left vm for patient to arrive by 9:00 for 05-10-21 appointment-Toni

## 2021-05-07 ENCOUNTER — Ambulatory Visit: Payer: PPO | Admitting: Hospice and Palliative Medicine

## 2021-05-10 ENCOUNTER — Encounter: Payer: Self-pay | Admitting: Nurse Practitioner

## 2021-05-10 ENCOUNTER — Other Ambulatory Visit: Payer: Self-pay

## 2021-05-10 ENCOUNTER — Ambulatory Visit (INDEPENDENT_AMBULATORY_CARE_PROVIDER_SITE_OTHER): Payer: Medicare HMO | Admitting: Nurse Practitioner

## 2021-05-10 VITALS — BP 166/50 | HR 84 | Temp 97.3°F | Resp 16 | Ht 61.5 in | Wt 113.2 lb

## 2021-05-10 DIAGNOSIS — R300 Dysuria: Secondary | ICD-10-CM | POA: Diagnosis not present

## 2021-05-10 DIAGNOSIS — E039 Hypothyroidism, unspecified: Secondary | ICD-10-CM

## 2021-05-10 DIAGNOSIS — Z124 Encounter for screening for malignant neoplasm of cervix: Secondary | ICD-10-CM | POA: Diagnosis not present

## 2021-05-10 DIAGNOSIS — Z0001 Encounter for general adult medical examination with abnormal findings: Secondary | ICD-10-CM

## 2021-05-10 DIAGNOSIS — M810 Age-related osteoporosis without current pathological fracture: Secondary | ICD-10-CM

## 2021-05-10 DIAGNOSIS — Z0189 Encounter for other specified special examinations: Secondary | ICD-10-CM | POA: Diagnosis not present

## 2021-05-10 DIAGNOSIS — E782 Mixed hyperlipidemia: Secondary | ICD-10-CM | POA: Diagnosis not present

## 2021-05-10 MED ORDER — LEVOTHYROXINE SODIUM 75 MCG PO TABS: 75.0000 ug | ORAL_TABLET | Freq: Every day | ORAL | 3 refills | Status: DC

## 2021-05-10 MED ORDER — FLUTICASONE PROPIONATE 50 MCG/ACT NA SUSP: 2.0000 | Freq: Every day | NASAL | 0 refills | Status: DC

## 2021-05-10 MED ORDER — AZELASTINE HCL 0.1 % NA SOLN: 2.0000 | Freq: Two times a day (BID) | NASAL | 2 refills | Status: DC

## 2021-05-10 NOTE — Progress Notes (Signed)
[facility] [place], [place] [address]  Internal MEDICINE  Office Visit Note  Patient Name: Tiffany Bruce  [id number]  [id number]  Date of Service: 05/10/2021  Chief Complaint  Patient presents with   Medicare Wellness    Refills     HPI [name] presents for an annual well visit and physical exam. she has a history of osteoporosis and hypothyroidism. Her last bone density scan was in 2017. She is due for a mammogram which is scheduled on 05/27/21.  She is retired and lives at home alone.  She works 3 days a week at [employer] and still drives herself to and from places.  She describes her self is very independent.  Although her previous bone density scan did show that she has osteoporosis, the patient will not take medication for osteoporosis.  She is planning to get her second COVID booster on Friday this week and will have her routine lab work drawn at that time as well.  She denies any pain but she does have a history of Bruce problems.  Occasionally she wears a Bruce brace when she is doing work around the house or yard work.  She is scheduled for her eye exam near the end of August and confirms that she gets yearly eye exams.  Current Medication: Outpatient Encounter Medications as of 05/10/2021  Medication Sig   Ascorbic Acid (VITAMIN C) 100 MG tablet Take 100 mg by mouth daily.   calcium-vitamin D (OSCAL WITH D) 250-125 MG-UNIT per tablet Take 1 tablet by mouth daily.   docusate sodium (COLACE) 100 MG capsule Take 100 mg by mouth 2 (two) times daily.   vitamin A 10000 UNIT capsule Take 10,000 Units by mouth daily.   vitamin B-12 (CYANOCOBALAMIN) 100 MCG tablet Take 100 mcg by mouth daily.   vitamin E 100 UNIT capsule Take by mouth daily.   [DISCONTINUED] azelastine (ASTELIN) 0.1 % nasal spray 2 sprays.   [DISCONTINUED] fluticasone (FLONASE) 50 MCG/ACT nasal spray 2 sprays.   [DISCONTINUED] levothyroxine (SYNTHROID) 75 MCG tablet Take 1 tablet (75 mcg total) by  mouth daily before breakfast.   azelastine (ASTELIN) 0.1 % nasal spray Place 2 sprays into both nostrils 2 (two) times daily.   fluticasone (FLONASE) 50 MCG/ACT nasal spray Place 2 sprays into both nostrils daily.   levothyroxine (SYNTHROID) 75 MCG tablet Take 1 tablet (75 mcg total) by mouth daily before breakfast.   [DISCONTINUED] mupirocin ointment (BACTROBAN) 2 % Apply small amount to effected areas BID (Patient not taking: Reported on 05/10/2021)   No facility-administered encounter medications on file as of 05/10/2021.    Surgical History: Past Surgical History:  Procedure Laterality Date   ABDOMINAL HYSTERECTOMY     ANTERIOR AND POSTERIOR VAGINAL REPAIR  12/15/2014,2010   ENTEROCELE REPAIR     TONSILLECTOMY     TUBAL LIGATION      Medical History: Past Medical History:  Diagnosis Date   Abnormal heart rhythm    Cervical lesion    Hypothyroidism    Osteoporosis    Vaginal atrophy    Vaginal atrophy     Family History: Family History  Problem Relation Age of Onset   Cancer Neg Hx    Diabetes Neg Hx    Heart disease Neg Hx    Breast cancer Neg Hx     Social History   Socioeconomic History   Marital status: Married    Spouse name: Not on file   Number of children: Not  on file   Years of education: Not on file   Highest education level: Not on file  Occupational History   Not on file  Tobacco Use   Smoking status: Never Smoker   Smokeless tobacco: Never Used  Vaping Use   Vaping Use: Never used  Substance and Sexual Activity   Alcohol use: No   Drug use: No   Sexual activity: Not Currently    Birth control/protection: Surgical  Other Topics Concern   Not on file  Social History Narrative   Not on file   Social Determinants of Health   Financial Resource Strain: Not on file  Food Insecurity: Not on file  Transportation Needs: Not on file  Physical Activity: Not on file  Stress: Not on file  Social Connections: Not on file  Intimate Partner Violence:  Not on file      Review of Systems  Constitutional:  Negative for activity change, appetite change, chills, fatigue, fever and unexpected weight change.  HENT: Negative.  Negative for congestion, ear pain, rhinorrhea, sore throat and trouble swallowing.   Eyes: Negative.   Respiratory: Negative.  Negative for cough, chest tightness, shortness of breath and wheezing.   Cardiovascular: Negative.  Negative for chest pain.  Gastrointestinal: Negative.  Negative for abdominal pain, blood in stool, constipation, diarrhea, nausea and vomiting.  Endocrine: Negative.   Genitourinary: Negative.  Negative for difficulty urinating, dysuria, frequency, hematuria and urgency.  Musculoskeletal: Negative.  Negative for arthralgias, Bruce pain, joint swelling, myalgias and neck pain.  Skin: Negative.  Negative for rash and wound.  Allergic/Immunologic: Negative.  Negative for immunocompromised state.  Neurological: Negative.  Negative for dizziness, seizures, numbness and headaches.  Hematological: Negative.   Psychiatric/Behavioral: Negative.  Negative for behavioral problems, self-injury and suicidal ideas. The patient is not nervous/anxious.     Vital Signs: BP (!) 166/50   Pulse 84   Temp (!) 97.3 F (36.3 C)   Resp 16   Ht 5' 1.5" (1.562 m)   Wt 113 lb 3.2 oz (51.3 kg)   SpO2 98%   BMI 21.04 kg/m    Physical Exam Vitals reviewed.  Constitutional:      General: She is not in acute distress.    Appearance: Normal appearance. She is well-developed and normal weight. She is not ill-appearing or diaphoretic.  HENT:     Head: Normocephalic and atraumatic.     Right Ear: Tympanic membrane, ear canal and external ear normal.     Left Ear: Tympanic membrane, ear canal and external ear normal.     Nose: Nose normal.     Mouth/Throat:     Mouth: Mucous membranes are moist.     Dentition: Abnormal dentition.     Pharynx: Oropharynx is clear. No oropharyngeal exudate.  Eyes:     Extraocular  Movements: Extraocular movements intact.     Conjunctiva/sclera: Conjunctivae normal.     Pupils: Pupils are equal, round, and reactive to light.  Neck:     Thyroid: No thyromegaly.     Vascular: No JVD.     Trachea: No tracheal deviation.  Cardiovascular:     Rate and Rhythm: Normal rate and regular rhythm.     Pulses: Normal pulses.     Heart sounds: Normal heart sounds. No murmur heard.   No friction rub. No gallop.  Pulmonary:     Effort: Pulmonary effort is normal. No respiratory distress.     Breath sounds: Normal breath sounds. No wheezing or rales.  Chest:     Chest wall: No tenderness.  Breasts:    Right: Normal. No inverted nipple, mass, nipple discharge, skin change, axillary adenopathy or supraclavicular adenopathy.     Left: Normal. No inverted nipple, mass, nipple discharge, skin change, axillary adenopathy or supraclavicular adenopathy.  Abdominal:     General: Bowel sounds are normal. There is no distension.     Palpations: Abdomen is soft. There is no mass.     Tenderness: There is no abdominal tenderness. There is no guarding or rebound.     Hernia: No hernia is present.  Musculoskeletal:        General: Normal range of motion.     Cervical Bruce: Normal range of motion and neck supple.  Lymphadenopathy:     Cervical: No cervical adenopathy.     Upper Body:     Right upper body: No supraclavicular or axillary adenopathy.     Left upper body: No supraclavicular or axillary adenopathy.  Skin:    General: Skin is warm and dry.     Capillary Refill: Capillary refill takes less than 2 seconds.  Neurological:     Mental Status: She is alert and oriented to person, place, and time.  Psychiatric:        Mood and Affect: Mood normal.        Behavior: Behavior normal.        Thought Content: Thought content normal.        Judgment: Judgment normal.        Assessment/Plan: 1. Encounter for general adult medical examination with abnormal findings Age-appropriate  preventive screenings discussed, annual physical exam completed. Refills of current medications ordered.  - fluticasone (FLONASE) 50 MCG/ACT nasal spray; Place 2 sprays into both nostrils daily.  Dispense: 16 g; Refill: 0 - azelastine (ASTELIN) 0.1 % nasal spray; Place 2 sprays into both nostrils 2 (two) times daily.  Dispense: 30 mL; Refill: 2  2. Encounter for routine laboratory testing Routine labs for health maintenance ordered. - CMP14+EGFR - CBC with Differential/Platelet - TSH + free T4  3. Age-related osteoporosis without current pathological fracture Check vitamin D level. Patient declines taking any medications for osteoporosis.  - Vitamin D (25 hydroxy)  4. Acquired hypothyroidism Currently on levothyroxine, need to recheck  - levothyroxine (SYNTHROID) 75 MCG tablet; Take 1 tablet (75 mcg total) by mouth daily before breakfast.  Dispense: 90 tablet; Refill: 3  5. Mixed hyperlipidemia Patient has a prior lipid panel drawn in June 2021 with elevated total cholesterol of 233 and elevated LDL of 156.  The patient is not on any medication for her elevated cholesterol.  Will check the lipid profile again to determine if the patient still needs to be started on pharmacotherapy. - Lipid Profile  6. Dysuria Routine urinalysis done. - UA/M w/rflx Culture, Routine     General Counseling: Sharlize verbalizes understanding of the findings of todays visit and agrees with plan of treatment. I have discussed any further diagnostic evaluation that may be needed or ordered today. We also reviewed her medications today. she has been encouraged to call the office with any questions or concerns that should arise related to todays visit.    Orders Placed This Encounter  Procedures   UA/M w/rflx Culture, Routine   CMP14+EGFR   CBC with Differential/Platelet   TSH + free T4   Vitamin D (25 hydroxy)   Lipid Profile    Meds ordered this encounter  Medications   fluticasone (FLONASE) 50  MCG/ACT  nasal spray    Sig: Place 2 sprays into both nostrils daily.    Dispense:  16 g    Refill:  0    Order Specific Question:   Supervising Provider    Answer:   Lavera Guise [[id number]]   azelastine (ASTELIN) 0.1 % nasal spray    Sig: Place 2 sprays into both nostrils 2 (two) times daily.    Dispense:  30 mL    Refill:  2    Order Specific Question:   Supervising Provider    Answer:   Lavera Guise [[id number]]   levothyroxine (SYNTHROID) 75 MCG tablet    Sig: Take 1 tablet (75 mcg total) by mouth daily before breakfast.    Dispense:  90 tablet    Refill:  3    Return in about 6 months (around 11/09/2021) for F/U, Labs TSH/T4, Deletha Jaffee PCP.   Total time spent:30 Minutes Time spent includes review of chart, medications, test results, and follow up plan with the patient.   [place] Controlled Substance Database was reviewed by me.  This patient was seen by Jonetta Osgood, FNP-C in collaboration with Dr. Clayborn Bigness as a part of collaborative care agreement.  Candus Braud R. Valetta Fuller, MSN, FNP-C Internal medicine

## 2021-05-13 LAB — MICROSCOPIC EXAMINATION
Bacteria, UA: NONE SEEN
Casts: NONE SEEN /lpf

## 2021-05-13 LAB — UA/M W/RFLX CULTURE, ROUTINE
Bilirubin, UA: NEGATIVE
Glucose, UA: NEGATIVE
Ketones, UA: NEGATIVE
Nitrite, UA: NEGATIVE
Protein,UA: NEGATIVE
RBC, UA: NEGATIVE
Specific Gravity, UA: 1.016 (ref 1.005–1.030)
Urobilinogen, Ur: 0.2 mg/dL (ref 0.2–1.0)
pH, UA: 5.5 (ref 5.0–7.5)

## 2021-05-13 LAB — URINE CULTURE, REFLEX

## 2021-05-14 DIAGNOSIS — M810 Age-related osteoporosis without current pathological fracture: Secondary | ICD-10-CM | POA: Diagnosis not present

## 2021-05-14 DIAGNOSIS — E782 Mixed hyperlipidemia: Secondary | ICD-10-CM | POA: Diagnosis not present

## 2021-05-14 DIAGNOSIS — Z0189 Encounter for other specified special examinations: Secondary | ICD-10-CM | POA: Diagnosis not present

## 2021-05-15 LAB — LIPID PANEL
Chol/HDL Ratio: 4.7 ratio — ABNORMAL HIGH (ref 0.0–4.4)
Cholesterol, Total: 249 mg/dL — ABNORMAL HIGH (ref 100–199)
HDL: 53 mg/dL (ref >39)
LDL Chol Calc (NIH): 176 mg/dL — ABNORMAL HIGH (ref 0–99)
Triglycerides: 111 mg/dL (ref 0–149)
VLDL Cholesterol Cal: 20 mg/dL (ref 5–40)

## 2021-05-15 LAB — CMP14+EGFR
ALT: 14 IU/L (ref 0–32)
AST: 20 IU/L (ref 0–40)
Albumin/Globulin Ratio: 2 (ref 1.2–2.2)
Albumin: 4.1 g/dL (ref 3.6–4.6)
Alkaline Phosphatase: 96 IU/L (ref 44–121)
BUN/Creatinine Ratio: 15 (ref 12–28)
BUN: 11 mg/dL (ref 8–27)
Bilirubin Total: 0.5 mg/dL (ref 0.0–1.2)
CO2: 25 mmol/L (ref 20–29)
Calcium: 9.7 mg/dL (ref 8.7–10.3)
Chloride: 102 mmol/L (ref 96–106)
Creatinine, Ser: 0.72 mg/dL (ref 0.57–1.00)
Globulin, Total: 2.1 g/dL (ref 1.5–4.5)
Glucose: 105 mg/dL — ABNORMAL HIGH (ref 65–99)
Potassium: 5 mmol/L (ref 3.5–5.2)
Sodium: 141 mmol/L (ref 134–144)
Total Protein: 6.2 g/dL (ref 6.0–8.5)
eGFR: 82 mL/min/{1.73_m2} (ref >59)

## 2021-05-15 LAB — CBC WITH DIFFERENTIAL/PLATELET
Basophils Absolute: 0.1 10*3/uL (ref 0.0–0.2)
Basos: 1 %
EOS (ABSOLUTE): 0.5 10*3/uL — ABNORMAL HIGH (ref 0.0–0.4)
Eos: 8 %
Hematocrit: 41.1 % (ref 34.0–46.6)
Hemoglobin: 13.5 g/dL (ref 11.1–15.9)
Immature Grans (Abs): 0 10*3/uL (ref 0.0–0.1)
Immature Granulocytes: 0 %
Lymphocytes Absolute: 2.9 10*3/uL (ref 0.7–3.1)
Lymphs: 46 %
MCH: 29.6 pg (ref 26.6–33.0)
MCHC: 32.8 g/dL (ref 31.5–35.7)
MCV: 90 fL (ref 79–97)
Monocytes Absolute: 0.5 10*3/uL (ref 0.1–0.9)
Monocytes: 8 %
Neutrophils Absolute: 2.3 10*3/uL (ref 1.4–7.0)
Neutrophils: 37 %
Platelets: 276 10*3/uL (ref 150–450)
RBC: 4.56 x10E6/uL (ref 3.77–5.28)
RDW: 12.3 % (ref 11.7–15.4)
WBC: 6.2 10*3/uL (ref 3.4–10.8)

## 2021-05-15 LAB — TSH+FREE T4
Free T4: 1.47 ng/dL (ref 0.82–1.77)
TSH: 0.13 u[IU]/mL — ABNORMAL LOW (ref 0.450–4.500)

## 2021-05-15 LAB — VITAMIN D 25 HYDROXY (VIT D DEFICIENCY, FRACTURES): Vit D, 25-Hydroxy: 48.8 ng/mL (ref 30.0–100.0)

## 2021-05-27 ENCOUNTER — Ambulatory Visit
Admission: RE | Admit: 2021-05-27 | Discharge: 2021-05-27 | Disposition: A | Discharge status: Home / Self Care | Payer: Medicare HMO | Source: Ambulatory Visit | Attending: Nurse Practitioner | Admitting: Nurse Practitioner

## 2021-05-27 ENCOUNTER — Other Ambulatory Visit: Payer: Self-pay | Admitting: Nurse Practitioner

## 2021-05-27 ENCOUNTER — Other Ambulatory Visit: Payer: Self-pay

## 2021-05-27 DIAGNOSIS — Z1239 Encounter for other screening for malignant neoplasm of breast: Secondary | ICD-10-CM

## 2021-05-27 DIAGNOSIS — Z1231 Encounter for screening mammogram for malignant neoplasm of breast: Secondary | ICD-10-CM | POA: Diagnosis not present

## 2021-06-10 ENCOUNTER — Telehealth: Payer: Self-pay

## 2021-06-10 NOTE — Telephone Encounter (Signed)
spoke with patient about last lab work was normal, inform pt her cholesterol level was a little high,patient stated she knows what to lower her levels.LNB

## 2021-06-11 ENCOUNTER — Telehealth: Payer: Self-pay

## 2021-06-11 NOTE — Telephone Encounter (Signed)
Call and left message for patient to call back to receive her mammograms results.LNB

## 2021-06-17 ENCOUNTER — Telehealth (INDEPENDENT_AMBULATORY_CARE_PROVIDER_SITE_OTHER): Payer: Medicare HMO | Admitting: Internal Medicine

## 2021-06-17 ENCOUNTER — Encounter: Payer: Self-pay | Admitting: Internal Medicine

## 2021-06-17 VITALS — BP 132/59 | HR 74 | Temp 96.7°F | Ht 62.0 in | Wt 112.0 lb

## 2021-06-17 DIAGNOSIS — J208 Acute bronchitis due to other specified organisms: Secondary | ICD-10-CM | POA: Diagnosis not present

## 2021-06-17 DIAGNOSIS — U071 COVID-19: Secondary | ICD-10-CM | POA: Diagnosis not present

## 2021-06-17 MED ORDER — PREDNISONE 10 MG PO TABS: ORAL_TABLET | ORAL | 0 refills | Status: DC

## 2021-06-17 MED ORDER — AZITHROMYCIN 250 MG PO TABS: ORAL_TABLET | ORAL | 0 refills | Status: DC

## 2021-06-17 NOTE — Progress Notes (Signed)
[facility] [address] [place], [place] [address]  Internal MEDICINE  Telephone Visit  Patient Name: Tiffany Bruce  [id number]  [id number]  Date of Service: 06/21/2021  I connected with the patient at 1030 by telephone and verified the patients identity using two identifiers.   I discussed the limitations, risks, security and privacy concerns of performing an evaluation and management service by telephone and the availability of in person appointments. I also discussed with the patient that there may be a patient responsible charge related to the service.  The patient expressed understanding and agrees to proceed.    Chief Complaint  Patient presents with   Telephone Assessment   Telephone Screen    [id number]   Sinusitis    Tested positive for covid yesterday   Cough    HPI Pt is connected via virtual visit Tested positive for COVID, c/o cough and congestion, had all 4 COVID vaccines  Exposed through family members  Denies any fever or chills   Current Medication: Outpatient Encounter Medications as of 06/17/2021  Medication Sig   Ascorbic Acid (VITAMIN C) 100 MG tablet Take 100 mg by mouth daily.   azelastine (ASTELIN) 0.1 % nasal spray Place 2 sprays into both nostrils 2 (two) times daily.   azithromycin (ZITHROMAX) 250 MG tablet Take one tab a day for 10 days for uri   calcium-vitamin D (OSCAL WITH D) 250-125 MG-UNIT per tablet Take 1 tablet by mouth daily.   docusate sodium (COLACE) 100 MG capsule Take 100 mg by mouth 2 (two) times daily.   fluticasone (FLONASE) 50 MCG/ACT nasal spray Place 2 sprays into both nostrils daily.   levothyroxine (SYNTHROID) 75 MCG tablet Take 1 tablet (75 mcg total) by mouth daily before breakfast.   predniSONE (DELTASONE) 10 MG tablet Take one tab 3 x day for 3 days, then take one tab 2 x a day for 3 days and then take one tab a day for 3 days for copd   vitamin A 64332 UNIT capsule Take 10,000 Units by mouth daily.   vitamin  B-12 (CYANOCOBALAMIN) 100 MCG tablet Take 100 mcg by mouth daily.   vitamin E 100 UNIT capsule Take by mouth daily.   No facility-administered encounter medications on file as of 06/17/2021.    Surgical History: Past Surgical History:  Procedure Laterality Date   ABDOMINAL HYSTERECTOMY     ANTERIOR AND POSTERIOR VAGINAL REPAIR  12/15/2014,2010   ENTEROCELE REPAIR     TONSILLECTOMY     TUBAL LIGATION      Medical History: Past Medical History:  Diagnosis Date   Abnormal heart rhythm    Cervical lesion    Hypothyroidism    Osteoporosis    Vaginal atrophy    Vaginal atrophy     Family History: Family History  Problem Relation Age of Onset   Cancer Neg Hx    Diabetes Neg Hx    Heart disease Neg Hx    Breast cancer Neg Hx     Social History   Socioeconomic History   Marital status: Married    Spouse name: Not on file   Number of children: Not on file   Years of education: Not on file   Highest education level: Not on file  Occupational History   Not on file  Tobacco Use   Smoking status: Never   Smokeless tobacco: Never  Vaping Use   Vaping Use: Never used  Substance and Sexual Activity   Alcohol use: No  Drug use: No   Sexual activity: Not Currently    Birth control/protection: Surgical  Other Topics Concern   Not on file  Social History Narrative   Not on file   Social Determinants of Health   Financial Resource Strain: Not on file  Food Insecurity: Not on file  Transportation Needs: Not on file  Physical Activity: Not on file  Stress: Not on file  Social Connections: Not on file  Intimate Partner Violence: Not on file      Review of Systems  Constitutional:  Negative for fatigue and fever.  HENT:  Negative for congestion, mouth sores and postnasal drip.   Respiratory:  Positive for cough.   Cardiovascular:  Negative for chest pain.  Genitourinary:  Negative for flank pain.  Psychiatric/Behavioral: Negative.     Vital Signs: BP (!) 132/59    Pulse 74   Temp (!) 96.7 F (35.9 C)   Ht 5\' 2"  (1.575 m)   Wt 112 lb (50.8 kg)   BMI 20.49 kg/m    Observation/Objective: Mild nasal congestion, NAD     Assessment/Plan: 1. Acute bronchitis due to COVID-19 virus Will monitor symptoms - azithromycin (ZITHROMAX) 250 MG tablet; Take one tab a day for 10 days for uri  Dispense: 10 tablet; Refill: 0 - predniSONE (DELTASONE) 10 MG tablet; Take one tab 3 x day for 3 days, then take one tab 2 x a day for 3 days and then take one tab a day for 3 days for copd  Dispense: 18 tablet; Refill: 0   General Counseling: Tiffany Bruce verbalizes understanding of the findings of today's phone visit and agrees with plan of treatment. I have discussed any further diagnostic evaluation that may be needed or ordered today. We also reviewed her medications today. she has been encouraged to call the office with any questions or concerns that should arise related to todays visit.    No orders of the defined types were placed in this encounter.   Meds ordered this encounter  Medications   azithromycin (ZITHROMAX) 250 MG tablet    Sig: Take one tab a day for 10 days for uri    Dispense:  10 tablet    Refill:  0   predniSONE (DELTASONE) 10 MG tablet    Sig: Take one tab 3 x day for 3 days, then take one tab 2 x a day for 3 days and then take one tab a day for 3 days for copd    Dispense:  18 tablet    Refill:  0    Time spent:10 Minutes    Dr Arna Medici Internal medicine

## 2021-07-07 DIAGNOSIS — H0100B Unspecified blepharitis left eye, upper and lower eyelids: Secondary | ICD-10-CM | POA: Diagnosis not present

## 2021-07-07 DIAGNOSIS — H0100A Unspecified blepharitis right eye, upper and lower eyelids: Secondary | ICD-10-CM | POA: Diagnosis not present

## 2021-07-07 DIAGNOSIS — H01003 Unspecified blepharitis right eye, unspecified eyelid: Secondary | ICD-10-CM | POA: Diagnosis not present

## 2021-07-07 DIAGNOSIS — H04123 Dry eye syndrome of bilateral lacrimal glands: Secondary | ICD-10-CM | POA: Diagnosis not present

## 2021-07-07 DIAGNOSIS — H353131 Nonexudative age-related macular degeneration, bilateral, early dry stage: Secondary | ICD-10-CM | POA: Diagnosis not present

## 2021-07-07 DIAGNOSIS — H2513 Age-related nuclear cataract, bilateral: Secondary | ICD-10-CM | POA: Diagnosis not present

## 2021-07-13 DIAGNOSIS — Z0100 Encounter for examination of eyes and vision without abnormal findings: Secondary | ICD-10-CM | POA: Diagnosis not present

## 2021-11-12 ENCOUNTER — Ambulatory Visit (INDEPENDENT_AMBULATORY_CARE_PROVIDER_SITE_OTHER): Payer: Medicare HMO | Admitting: Nurse Practitioner

## 2021-11-12 ENCOUNTER — Other Ambulatory Visit: Payer: Self-pay

## 2021-11-12 ENCOUNTER — Encounter: Payer: Self-pay | Admitting: Nurse Practitioner

## 2021-11-12 ENCOUNTER — Encounter (INDEPENDENT_AMBULATORY_CARE_PROVIDER_SITE_OTHER): Payer: Self-pay

## 2021-11-12 VITALS — BP 154/63 | HR 90 | Temp 98.3°F | Resp 16 | Ht 62.0 in | Wt 112.8 lb

## 2021-11-12 DIAGNOSIS — Z23 Encounter for immunization: Secondary | ICD-10-CM

## 2021-11-12 DIAGNOSIS — M810 Age-related osteoporosis without current pathological fracture: Secondary | ICD-10-CM

## 2021-11-12 DIAGNOSIS — E039 Hypothyroidism, unspecified: Secondary | ICD-10-CM | POA: Diagnosis not present

## 2021-11-12 DIAGNOSIS — R030 Elevated blood-pressure reading, without diagnosis of hypertension: Secondary | ICD-10-CM | POA: Diagnosis not present

## 2021-11-12 MED ORDER — TETANUS-DIPHTHERIA TOXOIDS TD 5-2 LFU IM INJ: 0.5000 mL | INJECTION | Freq: Once | INTRAMUSCULAR | 0 refills | Status: AC

## 2021-11-12 MED ORDER — PNEUMOCOCCAL 20-VAL CONJ VACC 0.5 ML IM SUSY: 0.5000 mL | PREFILLED_SYRINGE | INTRAMUSCULAR | 0 refills | Status: AC

## 2021-11-12 NOTE — Progress Notes (Signed)
[facility] [address] [place], [place] [address]  Internal MEDICINE  Office Visit Note  Patient Name: Tiffany Bruce  [id number]  [id number]  Date of Service: 11/12/2021  Chief Complaint  Patient presents with   Follow-up   Hypothyroidism   Results    HPI [name] presents for a follow up visit for hypothyroidism and lab results. Her blood pressure is significantly elevated in office today but is within normal limits at home. She checks it at home and it is less than 140/90 consistently. She continues to work 3 days a week at [employer]. Her labs are grossly normal. Her lipid panel is elevated.    Current Medication: Outpatient Encounter Medications as of 11/12/2021  Medication Sig   Ascorbic Acid (VITAMIN C) 100 MG tablet Take 100 mg by mouth daily.   azelastine (ASTELIN) 0.1 % nasal spray Place 2 sprays into both nostrils 2 (two) times daily.   calcium-vitamin D (OSCAL WITH D) 250-125 MG-UNIT per tablet Take 1 tablet by mouth daily.   docusate sodium (COLACE) 100 MG capsule Take 100 mg by mouth 2 (two) times daily.   fluticasone (FLONASE) 50 MCG/ACT nasal spray Place 2 sprays into both nostrils daily.   levothyroxine (SYNTHROID) 75 MCG tablet Take 1 tablet (75 mcg total) by mouth daily before breakfast.   pneumococcal 20-valent conjugate vaccine (PREVNAR 20) 0.5 ML injection Inject 0.5 mLs into the muscle tomorrow at 10 am for 1 dose.   Tdap (BOOSTRIX) 5-2.5-18.5 LF-MCG/0.5 injection Inject 0.5 mLs into the muscle once.   tetanus & diphtheria toxoids, adult, (TENIVAC) 5-2 LFU injection Inject 0.5 mLs into the muscle once for 1 dose.   vitamin A 66440 UNIT capsule Take 10,000 Units by mouth daily.   vitamin B-12 (CYANOCOBALAMIN) 100 MCG tablet Take 100 mcg by mouth daily.   vitamin E 100 UNIT capsule Take by mouth daily.   [DISCONTINUED] azithromycin (ZITHROMAX) 250 MG tablet Take one tab a day for 10 days for uri   [DISCONTINUED] pneumococcal 20-valent conjugate  vaccine (PREVNAR 20) 0.5 ML injection Inject 0.5 mLs into the muscle tomorrow at 10 am.   [DISCONTINUED] predniSONE (DELTASONE) 10 MG tablet Take one tab 3 x day for 3 days, then take one tab 2 x a day for 3 days and then take one tab a day for 3 days for copd   No facility-administered encounter medications on file as of 11/12/2021.    Surgical History: Past Surgical History:  Procedure Laterality Date   ABDOMINAL HYSTERECTOMY     ANTERIOR AND POSTERIOR VAGINAL REPAIR  12/15/2014,2010   ENTEROCELE REPAIR     TONSILLECTOMY     TUBAL LIGATION      Medical History: Past Medical History:  Diagnosis Date   Abnormal heart rhythm    Cervical lesion    Hypothyroidism    Osteoporosis    Vaginal atrophy    Vaginal atrophy     Family History: Family History  Problem Relation Age of Onset   Cancer Neg Hx    Diabetes Neg Hx    Heart disease Neg Hx    Breast cancer Neg Hx     Social History   Socioeconomic History   Marital status: Married    Spouse name: Not on file   Number of children: Not on file   Years of education: Not on file   Highest education level: Not on file  Occupational History   Not on file  Tobacco Use   Smoking status:  Never   Smokeless tobacco: Never  Vaping Use   Vaping Use: Never used  Substance and Sexual Activity   Alcohol use: No   Drug use: No   Sexual activity: Not Currently    Birth control/protection: Surgical  Other Topics Concern   Not on file  Social History Narrative   Not on file   Social Determinants of Health   Financial Resource Strain: Not on file  Food Insecurity: Not on file  Transportation Needs: Not on file  Physical Activity: Not on file  Stress: Not on file  Social Connections: Not on file  Intimate Partner Violence: Not on file      Review of Systems  Constitutional:  Negative for chills, fatigue and unexpected weight change.  HENT:  Negative for congestion, rhinorrhea, sneezing and sore throat.   Eyes:   Negative for redness.  Respiratory:  Negative for cough, chest tightness and shortness of breath.   Cardiovascular:  Negative for chest pain and palpitations.  Gastrointestinal:  Negative for abdominal pain, constipation, diarrhea, nausea and vomiting.  Genitourinary:  Negative for dysuria and frequency.  Musculoskeletal:  Negative for arthralgias, back pain, joint swelling and neck pain.  Skin:  Negative for rash.  Neurological: Negative.  Negative for tremors and numbness.  Hematological:  Negative for adenopathy. Does not bruise/bleed easily.  Psychiatric/Behavioral:  Negative for behavioral problems (Depression), sleep disturbance and suicidal ideas. The patient is not nervous/anxious.    Vital Signs: BP (!) 154/63   Pulse 90   Temp 98.3 F (36.8 C)   Resp 16   Ht 5\' 2"  (1.575 m)   Wt 112 lb 12.8 oz (51.2 kg)   SpO2 98%   BMI 20.63 kg/m    Physical Exam Vitals reviewed.  Constitutional:      General: She is not in acute distress.    Appearance: Normal appearance. She is normal weight. She is not ill-appearing.  HENT:     Head: Normocephalic and atraumatic.  Eyes:     Pupils: Pupils are equal, round, and reactive to light.  Cardiovascular:     Rate and Rhythm: Normal rate and regular rhythm.  Neurological:     Mental Status: She is alert and oriented to person, place, and time.     Cranial Nerves: No cranial nerve deficit.     Coordination: Coordination normal.     Gait: Gait normal.  Psychiatric:        Mood and Affect: Mood normal.        Behavior: Behavior normal.       Assessment/Plan: 1. Acquired hypothyroidism Repeat thyroid lab. Will call with results - TSH + free T4  2. Age-related osteoporosis without current pathological fracture Stable, takes calcium and vitamin D supplement.  3. White coat syndrome without diagnosis of hypertension Periodically checks blood pressure at home and it is normal.   4. Need for vaccination - pneumococcal 20-valent  conjugate vaccine (PREVNAR 20) 0.5 ML injection; Inject 0.5 mLs into the muscle tomorrow at 10 am for 1 dose.  Dispense: 0.5 mL; Refill: 0 - tetanus & diphtheria toxoids, adult, (TENIVAC) 5-2 LFU injection; Inject 0.5 mLs into the muscle once for 1 dose.  Dispense: 0.5 mL; Refill: 0   General Counseling: Geeta verbalizes understanding of the findings of todays visit and agrees with plan of treatment. I have discussed any further diagnostic evaluation that may be needed or ordered today. We also reviewed her medications today. she has been encouraged to call the office with  any questions or concerns that should arise related to todays visit.    Orders Placed This Encounter  Procedures   TSH + free T4    Meds ordered this encounter  Medications   pneumococcal 20-valent conjugate vaccine (PREVNAR 20) 0.5 ML injection    Sig: Inject 0.5 mLs into the muscle tomorrow at 10 am for 1 dose.    Dispense:  0.5 mL    Refill:  0   tetanus & diphtheria toxoids, adult, (TENIVAC) 5-2 LFU injection    Sig: Inject 0.5 mLs into the muscle once for 1 dose.    Dispense:  0.5 mL    Refill:  0    Return in about 6 months (around 05/13/2022) for CPE, Naia Ruff PCP.   Total time spent:30 Minutes Time spent includes review of chart, medications, test results, and follow up plan with the patient.   [place] Controlled Substance Database was reviewed by me.  This patient was seen by Sallyanne Kuster, FNP-C in collaboration with Dr. Beverely Risen as a part of collaborative care agreement.   Elinda Bunten R. Tedd Sias, MSN, FNP-C Internal medicine

## 2022-02-08 ENCOUNTER — Other Ambulatory Visit: Payer: Self-pay | Admitting: *Deleted

## 2022-02-09 DIAGNOSIS — H04123 Dry eye syndrome of bilateral lacrimal glands: Secondary | ICD-10-CM | POA: Diagnosis not present

## 2022-02-09 DIAGNOSIS — H353131 Nonexudative age-related macular degeneration, bilateral, early dry stage: Secondary | ICD-10-CM | POA: Diagnosis not present

## 2022-02-09 DIAGNOSIS — H01003 Unspecified blepharitis right eye, unspecified eyelid: Secondary | ICD-10-CM | POA: Diagnosis not present

## 2022-02-09 DIAGNOSIS — H0100B Unspecified blepharitis left eye, upper and lower eyelids: Secondary | ICD-10-CM | POA: Diagnosis not present

## 2022-02-09 DIAGNOSIS — H2513 Age-related nuclear cataract, bilateral: Secondary | ICD-10-CM | POA: Diagnosis not present

## 2022-02-09 DIAGNOSIS — H0100A Unspecified blepharitis right eye, upper and lower eyelids: Secondary | ICD-10-CM | POA: Diagnosis not present

## 2022-03-21 DIAGNOSIS — H04123 Dry eye syndrome of bilateral lacrimal glands: Secondary | ICD-10-CM | POA: Diagnosis not present

## 2022-03-21 DIAGNOSIS — H40053 Ocular hypertension, bilateral: Secondary | ICD-10-CM | POA: Diagnosis not present

## 2022-03-21 DIAGNOSIS — H2513 Age-related nuclear cataract, bilateral: Secondary | ICD-10-CM | POA: Diagnosis not present

## 2022-03-21 DIAGNOSIS — H353133 Nonexudative age-related macular degeneration, bilateral, advanced atrophic without subfoveal involvement: Secondary | ICD-10-CM | POA: Diagnosis not present

## 2022-04-21 DIAGNOSIS — H2512 Age-related nuclear cataract, left eye: Secondary | ICD-10-CM | POA: Diagnosis not present

## 2022-04-25 ENCOUNTER — Other Ambulatory Visit: Payer: Self-pay | Admitting: Nurse Practitioner

## 2022-04-25 DIAGNOSIS — Z1231 Encounter for screening mammogram for malignant neoplasm of breast: Secondary | ICD-10-CM

## 2022-05-03 ENCOUNTER — Telehealth: Payer: Self-pay

## 2022-05-03 NOTE — Telephone Encounter (Signed)
Left vm to confirm 05/09/22 appointment-Toni 

## 2022-05-09 ENCOUNTER — Ambulatory Visit (INDEPENDENT_AMBULATORY_CARE_PROVIDER_SITE_OTHER): Payer: Medicare HMO | Admitting: Nurse Practitioner

## 2022-05-09 ENCOUNTER — Encounter: Payer: Self-pay | Admitting: Nurse Practitioner

## 2022-05-09 VITALS — BP 175/68 | HR 73 | Temp 98.1°F | Resp 16 | Ht 62.0 in | Wt 108.8 lb

## 2022-05-09 DIAGNOSIS — M810 Age-related osteoporosis without current pathological fracture: Secondary | ICD-10-CM

## 2022-05-09 DIAGNOSIS — E782 Mixed hyperlipidemia: Secondary | ICD-10-CM | POA: Diagnosis not present

## 2022-05-09 DIAGNOSIS — E559 Vitamin D deficiency, unspecified: Secondary | ICD-10-CM | POA: Diagnosis not present

## 2022-05-09 DIAGNOSIS — E039 Hypothyroidism, unspecified: Secondary | ICD-10-CM

## 2022-05-09 DIAGNOSIS — R300 Dysuria: Secondary | ICD-10-CM | POA: Diagnosis not present

## 2022-05-09 DIAGNOSIS — Z0001 Encounter for general adult medical examination with abnormal findings: Secondary | ICD-10-CM | POA: Diagnosis not present

## 2022-05-09 MED ORDER — LEVOTHYROXINE SODIUM 75 MCG PO TABS: 75.0000 ug | ORAL_TABLET | Freq: Every day | ORAL | 3 refills | Status: DC

## 2022-05-09 NOTE — Progress Notes (Signed)
[facility] [place], [place] [address]  Internal MEDICINE  Office Visit Note  Patient Name: Tiffany Bruce  [id number]  [id number]  Date of Service: 05/09/2022  Chief Complaint  Patient presents with   Medicare Wellness   Hypertension    HPI Tiffany Bruce presents for an annual well visit and physical exam. She is a well appearing 88 yo female with hypertension, hyperlipidemia and vitamin D deficiency. She still lives alone. She continues to driver herself to and from work and [place] appointments and is still working 3 days a week at [employer]. She states she will continue to do so as long as she is able and still enjoys it. She is in good spirits and reports that she feels well.  She has no preventive screenings due but does continue to have routine screening mammograms done yearly per patient preference. She is scheduled to have her mammogram done later this month. She is due for routine labs. She has no other concerns. She denies any new or worsening pain.     Current Medication: Outpatient Encounter Medications as of 05/09/2022  Medication Sig   Ascorbic Acid (VITAMIN C) 100 MG tablet Take 100 mg by mouth daily.   azelastine (ASTELIN) 0.1 % nasal spray Place 2 sprays into both nostrils 2 (two) times daily.   calcium-vitamin D (OSCAL WITH D) 250-125 MG-UNIT per tablet Take 1 tablet by mouth daily.   docusate sodium (COLACE) 100 MG capsule Take 100 mg by mouth 2 (two) times daily.   fluticasone (FLONASE) 50 MCG/ACT nasal spray Place 2 sprays into both nostrils daily.   Tdap (BOOSTRIX) 5-2.5-18.5 LF-MCG/0.5 injection Inject 0.5 mLs into the muscle once.   vitamin A 10000 UNIT capsule Take 10,000 Units by mouth daily.   vitamin B-12 (CYANOCOBALAMIN) 100 MCG tablet Take 100 mcg by mouth daily.   vitamin E 100 UNIT capsule Take by mouth daily.   [DISCONTINUED] levothyroxine (SYNTHROID) 75 MCG tablet Take 1 tablet (75 mcg total) by mouth daily before breakfast.    levothyroxine (SYNTHROID) 75 MCG tablet Take 1 tablet (75 mcg total) by mouth daily before breakfast.   No facility-administered encounter medications on file as of 05/09/2022.    Surgical History: Past Surgical History:  Procedure Laterality Date   ABDOMINAL HYSTERECTOMY     ANTERIOR AND POSTERIOR VAGINAL REPAIR  12/15/2014,2010   ENTEROCELE REPAIR     TONSILLECTOMY     TUBAL LIGATION      Medical History: Past Medical History:  Diagnosis Date   Abnormal heart rhythm    Cervical lesion    Hypothyroidism    Osteoporosis    Vaginal atrophy    Vaginal atrophy     Family History: Family History  Problem Relation Age of Onset   Cancer Neg Hx    Diabetes Neg Hx    Heart disease Neg Hx    Breast cancer Neg Hx     Social History   Socioeconomic History   Marital status: Married    Spouse name: Not on file   Number of children: Not on file   Years of education: Not on file   Highest education level: Not on file  Occupational History   Not on file  Tobacco Use   Smoking status: Never   Smokeless tobacco: Never  Vaping Use   Vaping Use: Never used  Substance and Sexual Activity   Alcohol use: No   Drug use: No   Sexual activity: Not Currently  Birth control/protection: Surgical  Other Topics Concern   Not on file  Social History Narrative   Not on file   Social Determinants of Health   Financial Resource Strain: Not on file  Food Insecurity: Not on file  Transportation Needs: Not on file  Physical Activity: Not on file  Stress: Not on file  Social Connections: Not on file  Intimate Partner Violence: Not on file      Review of Systems  Constitutional:  Negative for activity change, appetite change, chills, fatigue, fever and unexpected weight change.  HENT:  Positive for hearing loss. Negative for congestion, ear pain, rhinorrhea, sore throat and trouble swallowing.   Eyes:  Positive for visual disturbance.  Respiratory: Negative.  Negative for cough,  chest tightness, shortness of breath and wheezing.   Cardiovascular: Negative.  Negative for chest pain.  Gastrointestinal: Negative.  Negative for abdominal pain, blood in stool, constipation, diarrhea, nausea and vomiting.  Endocrine: Negative.   Genitourinary: Negative.  Negative for difficulty urinating, dysuria, frequency, hematuria and urgency.  Musculoskeletal: Negative.  Negative for arthralgias, back pain, joint swelling, myalgias and neck pain.  Skin: Negative.  Negative for rash and wound.  Allergic/Immunologic: Negative.  Negative for immunocompromised state.  Neurological: Negative.  Negative for dizziness, seizures, numbness and headaches.  Hematological: Negative.   Psychiatric/Behavioral: Negative.  Negative for behavioral problems, self-injury and suicidal ideas. The patient is not nervous/anxious.    Vital Signs: BP (!) 175/68   Pulse 73   Temp 98.1 F (36.7 C)   Resp 16   Ht 5' 2"  (1.575 m)   Wt 108 lb 12.8 oz (49.4 kg)   SpO2 97%   BMI 19.90 kg/m    Physical Exam Vitals reviewed.  Constitutional:      General: She is awake. She is not in acute distress.    Appearance: Normal appearance. She is well-developed and normal weight. She is not ill-appearing or diaphoretic.  HENT:     Head: Normocephalic and atraumatic.     Right Ear: Tympanic membrane, ear canal and external ear normal.     Left Ear: Tympanic membrane, ear canal and external ear normal.     Nose: Nose normal. No congestion or rhinorrhea.     Mouth/Throat:     Lips: Pink.     Mouth: Mucous membranes are moist.     Pharynx: Oropharynx is clear. Uvula midline. No oropharyngeal exudate or posterior oropharyngeal erythema.  Eyes:     General: Lids are normal. Vision grossly intact. Gaze aligned appropriately. No scleral icterus.       Right eye: No discharge.        Left eye: No discharge.     Extraocular Movements: Extraocular movements intact.     Conjunctiva/sclera: Conjunctivae normal.      Pupils: Pupils are equal, round, and reactive to light.     Funduscopic exam:    Right eye: Red reflex present.        Left eye: Red reflex present. Neck:     Thyroid: No thyromegaly.     Vascular: No carotid bruit or JVD.     Trachea: Trachea and phonation normal. No tracheal deviation.  Cardiovascular:     Rate and Rhythm: Normal rate and regular rhythm.     Pulses: Normal pulses.     Heart sounds: Normal heart sounds, S1 normal and S2 normal. No murmur heard.    No friction rub. No gallop.  Pulmonary:     Effort: Pulmonary effort is  normal. No accessory muscle usage or respiratory distress.     Breath sounds: Normal breath sounds and air entry. No stridor. No wheezing or rales.  Chest:     Chest wall: No tenderness.  Abdominal:     General: Bowel sounds are normal. There is no distension.     Palpations: Abdomen is soft. There is no mass.     Tenderness: There is no abdominal tenderness. There is no guarding or rebound.  Musculoskeletal:        General: No tenderness or deformity. Normal range of motion.     Cervical back: Normal range of motion and neck supple.     Right lower leg: No edema.     Left lower leg: No edema.  Lymphadenopathy:     Cervical: No cervical adenopathy.  Skin:    General: Skin is warm and dry.     Capillary Refill: Capillary refill takes less than 2 seconds.     Coloration: Skin is not pale.     Findings: No erythema or rash.  Neurological:     Mental Status: She is alert and oriented to person, place, and time.     Cranial Nerves: No cranial nerve deficit.     Motor: No abnormal muscle tone.     Coordination: Coordination normal.     Deep Tendon Reflexes: Reflexes are normal and symmetric.  Psychiatric:        Mood and Affect: Mood normal.        Behavior: Behavior normal. Behavior is cooperative.        Thought Content: Thought content normal.        Judgment: Judgment normal.        Assessment/Plan: 1. Encounter for general adult  medical examination with abnormal findings Age-appropriate preventive screenings and vaccinations discussed, annual physical exam completed. Routine labs for health maintenance ordered, see below. PHM updated.  - CMP14+EGFR - CBC with Differential/Platelet - levothyroxine (SYNTHROID) 75 MCG tablet; Take 1 tablet (75 mcg total) by mouth daily before breakfast.  Dispense: 90 tablet; Refill: 3  2. Acquired hypothyroidism Routine labs ordered.  - CMP14+EGFR - CBC with Differential/Platelet - TSH + free T4 - levothyroxine (SYNTHROID) 75 MCG tablet; Take 1 tablet (75 mcg total) by mouth daily before breakfast.  Dispense: 90 tablet; Refill: 3  3. Mixed hyperlipidemia Routine labs ordered.  - CMP14+EGFR - CBC with Differential/Platelet - Lipid Profile - levothyroxine (SYNTHROID) 75 MCG tablet; Take 1 tablet (75 mcg total) by mouth daily before breakfast.  Dispense: 90 tablet; Refill: 3  4. Age-related osteoporosis without current pathological fracture Routine labs ordered, levothyroxine refills ordered ,  routine labs ordered.  - CMP14+EGFR - CBC with Differential/Platelet - levothyroxine (SYNTHROID) 75 MCG tablet; Take 1 tablet (75 mcg total) by mouth daily before breakfast.  Dispense: 90 tablet; Refill: 3  5. Vitamin D deficiency Routine labs ordered - CMP14+EGFR - CBC with Differential/Platelet - Vitamin D (25 hydroxy)  6. Dysuria Outien urinalysis done. - UA/M w/rflx Culture, Routine - CMP14+EGFR - CBC with Differential/Platelet - levothyroxine (SYNTHROID) 75 MCG tablet; Take 1 tablet (75 mcg total) by mouth daily before breakfast.  Dispense: 90 tablet; Refill: 3 - Microscopic Examination - Urine Culture, Reflex      General Counseling: Tiffany Bruce verbalizes understanding of the findings of todays visit and agrees with plan of treatment. I have discussed any further diagnostic evaluation that may be needed or ordered today. We also reviewed her medications today. she has been  encouraged to call  the office with any questions or concerns that should arise related to todays visit.    Orders Placed This Encounter  Procedures   UA/M w/rflx Culture, Routine   CMP14+EGFR   CBC with Differential/Platelet   Lipid Profile   TSH + free T4   Vitamin D (25 hydroxy)    Meds ordered this encounter  Medications   levothyroxine (SYNTHROID) 75 MCG tablet    Sig: Take 1 tablet (75 mcg total) by mouth daily before breakfast.    Dispense:  90 tablet    Refill:  3    For future refills    Return in about 6 months (around 11/08/2022) for F/U, Tiffany Bruce PCP.   Total time spent:30 Minutes Time spent includes review of chart, medications, test results, and follow up plan with the patient.   [place] Controlled Substance Database was reviewed by me.  This patient was seen by Jonetta Osgood, FNP-C in collaboration with Dr. Clayborn Bigness as a part of collaborative care agreement.  Tiffany Vessell R. Valetta Fuller, MSN, FNP-C Internal medicine

## 2022-05-10 ENCOUNTER — Ambulatory Visit: Payer: Medicare HMO | Admitting: Nurse Practitioner

## 2022-05-10 LAB — CBC WITH DIFFERENTIAL/PLATELET
Basophils Absolute: 0.1 10*3/uL (ref 0.0–0.2)
Basos: 1 %
EOS (ABSOLUTE): 0.3 10*3/uL (ref 0.0–0.4)
Eos: 4 %
Hematocrit: 37.8 % (ref 34.0–46.6)
Hemoglobin: 12.3 g/dL (ref 11.1–15.9)
Immature Grans (Abs): 0 10*3/uL (ref 0.0–0.1)
Immature Granulocytes: 0 %
Lymphocytes Absolute: 2.2 10*3/uL (ref 0.7–3.1)
Lymphs: 34 %
MCH: 29.5 pg (ref 26.6–33.0)
MCHC: 32.5 g/dL (ref 31.5–35.7)
MCV: 91 fL (ref 79–97)
Monocytes Absolute: 0.5 10*3/uL (ref 0.1–0.9)
Monocytes: 8 %
Neutrophils Absolute: 3.3 10*3/uL (ref 1.4–7.0)
Neutrophils: 53 %
Platelets: 270 10*3/uL (ref 150–450)
RBC: 4.17 x10E6/uL (ref 3.77–5.28)
RDW: 12.9 % (ref 11.7–15.4)
WBC: 6.4 10*3/uL (ref 3.4–10.8)

## 2022-05-10 LAB — CMP14+EGFR
ALT: 28 IU/L (ref 0–32)
AST: 30 IU/L (ref 0–40)
Albumin/Globulin Ratio: 2.5 — ABNORMAL HIGH (ref 1.2–2.2)
Albumin: 4.2 g/dL (ref 3.6–4.6)
Alkaline Phosphatase: 96 IU/L (ref 44–121)
BUN/Creatinine Ratio: 17 (ref 12–28)
BUN: 12 mg/dL (ref 8–27)
Bilirubin Total: 0.3 mg/dL (ref 0.0–1.2)
CO2: 25 mmol/L (ref 20–29)
Calcium: 9.5 mg/dL (ref 8.7–10.3)
Chloride: 105 mmol/L (ref 96–106)
Creatinine, Ser: 0.69 mg/dL (ref 0.57–1.00)
Globulin, Total: 1.7 g/dL (ref 1.5–4.5)
Glucose: 114 mg/dL — ABNORMAL HIGH (ref 70–99)
Potassium: 4.8 mmol/L (ref 3.5–5.2)
Sodium: 142 mmol/L (ref 134–144)
Total Protein: 5.9 g/dL — ABNORMAL LOW (ref 6.0–8.5)
eGFR: 85 mL/min/{1.73_m2} (ref >59)

## 2022-05-10 LAB — VITAMIN D 25 HYDROXY (VIT D DEFICIENCY, FRACTURES): Vit D, 25-Hydroxy: 43 ng/mL (ref 30.0–100.0)

## 2022-05-10 LAB — TSH+FREE T4
Free T4: 1.41 ng/dL (ref 0.82–1.77)
TSH: 0.083 u[IU]/mL — ABNORMAL LOW (ref 0.450–4.500)

## 2022-05-10 LAB — LIPID PANEL
Chol/HDL Ratio: 5 ratio — ABNORMAL HIGH (ref 0.0–4.4)
Cholesterol, Total: 222 mg/dL — ABNORMAL HIGH (ref 100–199)
HDL: 44 mg/dL (ref >39)
LDL Chol Calc (NIH): 152 mg/dL — ABNORMAL HIGH (ref 0–99)
Triglycerides: 145 mg/dL (ref 0–149)
VLDL Cholesterol Cal: 26 mg/dL (ref 5–40)

## 2022-05-12 LAB — UA/M W/RFLX CULTURE, ROUTINE
Bilirubin, UA: NEGATIVE
Glucose, UA: NEGATIVE
Ketones, UA: NEGATIVE
Nitrite, UA: NEGATIVE
Protein,UA: NEGATIVE
RBC, UA: NEGATIVE
Specific Gravity, UA: 1.013 (ref 1.005–1.030)
Urobilinogen, Ur: 0.2 mg/dL (ref 0.2–1.0)
pH, UA: 5 (ref 5.0–7.5)

## 2022-05-12 LAB — MICROSCOPIC EXAMINATION
Casts: NONE SEEN /lpf
Epithelial Cells (non renal): NONE SEEN /hpf (ref 0–10)
RBC, Urine: NONE SEEN /hpf (ref 0–2)
WBC, UA: >30 /hpf — AB (ref 0–5)

## 2022-05-12 LAB — URINE CULTURE, REFLEX

## 2022-05-16 ENCOUNTER — Encounter: Payer: Self-pay | Admitting: Ophthalmology

## 2022-05-16 NOTE — Discharge Instructions (Signed)

## 2022-05-18 ENCOUNTER — Ambulatory Visit: Payer: Medicare HMO | Admitting: Anesthesiology

## 2022-05-18 ENCOUNTER — Encounter
Admission: RE | Disposition: A | Discharge status: Home / Self Care | Payer: Self-pay | Source: Home / Self Care | Attending: Ophthalmology

## 2022-05-18 ENCOUNTER — Ambulatory Visit
Admission: RE | Admit: 2022-05-18 | Discharge: 2022-05-18 | Disposition: A | Discharge status: Home / Self Care | Payer: Medicare HMO | Attending: Ophthalmology | Admitting: Ophthalmology

## 2022-05-18 ENCOUNTER — Other Ambulatory Visit: Payer: Self-pay

## 2022-05-18 ENCOUNTER — Encounter: Payer: Self-pay | Admitting: Ophthalmology

## 2022-05-18 DIAGNOSIS — H2512 Age-related nuclear cataract, left eye: Secondary | ICD-10-CM | POA: Diagnosis not present

## 2022-05-18 DIAGNOSIS — R030 Elevated blood-pressure reading, without diagnosis of hypertension: Secondary | ICD-10-CM

## 2022-05-18 DIAGNOSIS — E039 Hypothyroidism, unspecified: Secondary | ICD-10-CM | POA: Diagnosis not present

## 2022-05-18 DIAGNOSIS — I10 Essential (primary) hypertension: Secondary | ICD-10-CM | POA: Insufficient documentation

## 2022-05-18 DIAGNOSIS — H25812 Combined forms of age-related cataract, left eye: Secondary | ICD-10-CM | POA: Diagnosis not present

## 2022-05-18 HISTORY — DX: Presence of dental prosthetic device (complete) (partial): Z97.2

## 2022-05-18 HISTORY — PX: CATARACT EXTRACTION W/PHACO: SHX586

## 2022-05-18 SURGERY — PHACOEMULSIFICATION, CATARACT, WITH IOL INSERTION
Anesthesia: Monitor Anesthesia Care | Site: Eye | Laterality: Left

## 2022-05-18 MED ORDER — SIGHTPATH DOSE#1 NA HYALUR & NA CHOND-NA HYALUR IO KIT
PACK | INTRAOCULAR | Status: DC | PRN
  Administered 2022-05-18: 1 via OPHTHALMIC

## 2022-05-18 MED ORDER — LACTATED RINGERS IV SOLN: INTRAVENOUS | Status: DC

## 2022-05-18 MED ORDER — SIGHTPATH DOSE#1 BSS IO SOLN
INTRAOCULAR | Status: DC | PRN
  Administered 2022-05-18: 1 mL via INTRAMUSCULAR

## 2022-05-18 MED ORDER — BRIMONIDINE TARTRATE-TIMOLOL 0.2-0.5 % OP SOLN
OPHTHALMIC | Status: DC | PRN
  Administered 2022-05-18: 1 [drp] via OPHTHALMIC

## 2022-05-18 MED ORDER — CEFUROXIME OPHTHALMIC INJECTION 1 MG/0.1 ML
INJECTION | OPHTHALMIC | Status: DC | PRN
  Administered 2022-05-18: 0.1 mL via INTRACAMERAL

## 2022-05-18 MED ORDER — ARMC OPHTHALMIC DILATING DROPS
1.0000 "application " | OPHTHALMIC | Status: DC | PRN
  Administered 2022-05-18 (×3): 1 via OPHTHALMIC

## 2022-05-18 MED ORDER — FENTANYL CITRATE (PF) 100 MCG/2ML IJ SOLN
INTRAMUSCULAR | Status: DC | PRN
  Administered 2022-05-18: 25 ug via INTRAVENOUS

## 2022-05-18 MED ORDER — SIGHTPATH DOSE#1 BSS IO SOLN
INTRAOCULAR | Status: DC | PRN
  Administered 2022-05-18: 64 mL via OPHTHALMIC

## 2022-05-18 MED ORDER — SIGHTPATH DOSE#1 BSS IO SOLN
INTRAOCULAR | Status: DC | PRN
  Administered 2022-05-18: 15 mL

## 2022-05-18 MED ORDER — TETRACAINE HCL 0.5 % OP SOLN
1.0000 [drp] | OPHTHALMIC | Status: DC | PRN
  Administered 2022-05-18 (×3): 1 [drp] via OPHTHALMIC

## 2022-05-18 SURGICAL SUPPLY — 11 items
CATARACT SUITE SIGHTPATH (MISCELLANEOUS) ×2 IMPLANT
FEE CATARACT SUITE SIGHTPATH (MISCELLANEOUS) ×1 IMPLANT
GLOVE SRG 8 PF TXTR STRL LF DI (GLOVE) ×1 IMPLANT
GLOVE SURG ENC TEXT LTX SZ7.5 (GLOVE) ×2 IMPLANT
GLOVE SURG UNDER POLY LF SZ8 (GLOVE) ×2
LENS IOL TECNIS EYHANCE 19.0 (Intraocular Lens) ×1 IMPLANT
NDL FILTER BLUNT 18X1 1/2 (NEEDLE) ×1 IMPLANT
NEEDLE FILTER BLUNT 18X 1/2SAF (NEEDLE) ×1
NEEDLE FILTER BLUNT 18X1 1/2 (NEEDLE) ×1 IMPLANT
SYR 3ML LL SCALE MARK (SYRINGE) ×2 IMPLANT
WATER STERILE IRR 250ML POUR (IV SOLUTION) ×2 IMPLANT

## 2022-05-18 NOTE — Anesthesia Postprocedure Evaluation (Signed)
Anesthesia Post Note  Patient: Tiffany Bruce  Procedure(s) Performed: CATARACT EXTRACTION PHACO AND INTRAOCULAR LENS PLACEMENT (IOC) LEFT 6.84 01:02.3 (Left: Eye)     Patient location during evaluation: PACU Anesthesia Type: MAC Level of consciousness: awake and alert Pain management: pain level controlled Vital Signs Assessment: post-procedure vital signs reviewed and stable Respiratory status: nonlabored ventilation and spontaneous breathing Cardiovascular status: blood pressure returned to baseline Postop Assessment: no apparent nausea or vomiting Anesthetic complications: no   No notable events documented.  Adreona Brand [employer]

## 2022-05-18 NOTE — Transfer of Care (Signed)
Immediate Anesthesia Transfer of Care Note  Patient: Tiffany Bruce  Procedure(s) Performed: CATARACT EXTRACTION PHACO AND INTRAOCULAR LENS PLACEMENT (IOC) LEFT 6.84 01:02.3 (Left: Eye)  Patient Location: PACU  Anesthesia Type: MAC  Level of Consciousness: awake, alert  and patient cooperative  Airway and Oxygen Therapy: Patient Spontanous Breathing and Patient connected to supplemental oxygen  Post-op Assessment: Post-op Vital signs reviewed, Patient's Cardiovascular Status Stable, Respiratory Function Stable, Patent Airway and No signs of Nausea or vomiting  Post-op Vital Signs: Reviewed and stable  Complications: No notable events documented.

## 2022-05-18 NOTE — Anesthesia Preprocedure Evaluation (Addendum)
Anesthesia Evaluation  Patient identified by MRN, date of birth, ID band Patient awake    Reviewed: Allergy & Precautions, NPO status , Patient's Chart, lab work & pertinent test results  Airway Mallampati: II  TM Distance: >3 FB Neck ROM: Full    Dental  (+) Partial Lower, Upper Dentures   Pulmonary neg pulmonary ROS,    Pulmonary exam normal        Cardiovascular hypertension, Normal cardiovascular exam     Neuro/Psych negative neurological ROS  negative psych ROS   GI/Hepatic negative GI ROS, Neg liver ROS,   Endo/Other  Hypothyroidism   Renal/GU negative Renal ROS     Musculoskeletal negative musculoskeletal ROS (+)   Abdominal Normal abdominal exam  (+)   Peds  Hematology negative hematology ROS (+)   Anesthesia Other Findings   Reproductive/Obstetrics negative OB ROS                            Anesthesia Physical Anesthesia Plan  ASA: 2  Anesthesia Plan: MAC   Post-op Pain Management: Minimal or no pain anticipated   Induction: Intravenous  PONV Risk Score and Plan: 2 and TIVA, Midazolam and Treatment may vary due to age or medical condition  Airway Management Planned: Nasal Cannula  Additional Equipment:   Intra-op Plan:   Post-operative Plan:   Informed Consent: I have reviewed the patients History and Physical, chart, labs and discussed the procedure including the risks, benefits and alternatives for the proposed anesthesia with the patient or authorized representative who has indicated his/her understanding and acceptance.     Dental advisory given  Plan Discussed with: CRNA  Anesthesia Plan Comments:         Anesthesia Quick Evaluation

## 2022-05-18 NOTE — Op Note (Signed)
OPERATIVE NOTE  Tiffany Bruce [id number] 05/18/2022   PREOPERATIVE DIAGNOSIS:  Nuclear sclerotic cataract left eye. H25.12   POSTOPERATIVE DIAGNOSIS:    Nuclear sclerotic cataract left eye.     PROCEDURE:  Phacoemusification with posterior chamber intraocular lens placement of the left eye  Ultrasound time: Procedure(s): CATARACT EXTRACTION PHACO AND INTRAOCULAR LENS PLACEMENT (IOC) LEFT 6.84 01:02.3 (Left)  LENS:   Implant Name Type Inv. Item Serial No. Manufacturer Lot No. LRB No. Used Action  LENS IOL TECNIS EYHANCE 19.0 - [id number] Intraocular Lens LENS IOL TECNIS EYHANCE 19.0 [id number] [employer]  Left 1 Implanted      SURGEON:  Deirdre Evener, MD   ANESTHESIA:  Topical with tetracaine drops and 2% Xylocaine jelly, augmented with 1% preservative-free intracameral lidocaine.    COMPLICATIONS:  None.   DESCRIPTION OF PROCEDURE:  The patient was identified in the holding room and transported to the operating room and placed in the supine position under the operating microscope.  The left eye was identified as the operative eye and it was prepped and draped in the usual sterile ophthalmic fashion.   A 1 millimeter clear-corneal paracentesis was made at the 1:30 position.  0.5 ml of preservative-free 1% lidocaine was injected into the anterior chamber.  The anterior chamber was filled with Viscoat viscoelastic.  A 2.4 millimeter keratome was used to make a near-clear corneal incision at the 10:30 position.  .  A curvilinear capsulorrhexis was made with a cystotome and capsulorrhexis forceps.  Balanced salt solution was used to hydrodissect and hydrodelineate the nucleus.   Phacoemulsification was then used in stop and chop fashion to remove the lens nucleus and epinucleus.  The remaining cortex was then removed using the irrigation and aspiration handpiece. Provisc was then placed into the capsular bag to distend it for lens placement.  A lens was then injected into the  capsular bag.  The remaining viscoelastic was aspirated.   Wounds were hydrated with balanced salt solution.  The anterior chamber was inflated to a physiologic pressure with balanced salt solution.  No wound leaks were noted. Cefuroxime 0.1 ml of a 10mg /ml solution was injected into the anterior chamber for a dose of 1 mg of intracameral antibiotic at the completion of the case.   Timolol and Brimonidine drops were applied to the eye.  The patient was taken to the recovery room in stable condition without complications of anesthesia or surgery.  Tiffany Bruce 05/18/2022, 8:36 AM

## 2022-05-18 NOTE — H&P (Signed)
[facility]   Primary Care Physician:  Jonetta Osgood, NP Ophthalmologist: Dr. Leandrew Koyanagi  Pre-Procedure History & Physical: HPI:  Tiffany Bruce is a 88 y.o. female here for ophthalmic surgery.   Past Medical History:  Diagnosis Date   Abnormal heart rhythm    Cervical lesion    Hypothyroidism    Osteoporosis    Vaginal atrophy    Vaginal atrophy    Wears dentures    full upper, partial lower    Past Surgical History:  Procedure Laterality Date   ABDOMINAL HYSTERECTOMY     ANTERIOR AND POSTERIOR VAGINAL REPAIR  12/15/2014,2010   ENTEROCELE REPAIR     TONSILLECTOMY     TUBAL LIGATION      Prior to Admission medications   Medication Sig Start Date End Date Taking? Authorizing Provider  Ascorbic Acid (VITAMIN C) 100 MG tablet Take 100 mg by mouth daily.   Yes [provider]  azelastine (ASTELIN) 0.1 % nasal spray Place 2 sprays into both nostrils 2 (two) times daily. 05/10/21  Yes Abernathy, Yetta Flock, NP  calcium-vitamin D (OSCAL WITH D) 250-125 MG-UNIT per tablet Take 1 tablet by mouth daily.   Yes [provider]  carboxymethylcellulose (REFRESH PLUS) 0.5 % SOLN 1 drop 3 (three) times daily as needed.   Yes [provider]  Carboxymethylcellulose Sodium (DRY EYE RELIEF OP) Apply to eye as needed.   Yes [provider]  docusate sodium (COLACE) 100 MG capsule Take 100 mg by mouth 2 (two) times daily.   Yes [provider]  Eyelid Cleansers (OCUSOFT EYELID CLEANSING EX) Apply topically as needed.   Yes [provider]  fluticasone (FLONASE) 50 MCG/ACT nasal spray Place 2 sprays into both nostrils daily. 05/10/21  Yes Jonetta Osgood, NP  levothyroxine (SYNTHROID) 75 MCG tablet Take 1 tablet (75 mcg total) by mouth daily before breakfast. 05/09/22  Yes Abernathy, Alyssa, NP  MAGNESIUM PO Take 250 mg by mouth daily.   Yes [provider]  Multiple Vitamins-Minerals (ALIVE MULTI-VITAMIN PO) Take by  mouth daily.   Yes [provider]  polycarbophil (FIBERCON) 625 MG tablet Take 625 mg by mouth daily.   Yes [provider]  Tdap (BOOSTRIX) 5-2.5-18.5 LF-MCG/0.5 injection Inject 0.5 mLs into the muscle once.   Yes [provider]  vitamin A 10000 UNIT capsule Take 10,000 Units by mouth daily.   Yes [provider]  vitamin B-12 (CYANOCOBALAMIN) 100 MCG tablet Take 100 mcg by mouth daily.   Yes [provider]  vitamin E 100 UNIT capsule Take by mouth daily.   Yes [provider]  zinc gluconate 50 MG tablet Take 50 mg by mouth daily.   Yes [provider]    Allergies as of 03/23/2022   (No Known Allergies)    Family History  Problem Relation Age of Onset   Cancer Neg Hx    Diabetes Neg Hx    Heart disease Neg Hx    Breast cancer Neg Hx     Social History   Socioeconomic History   Marital status: Married    Spouse name: Not on file   Number of children: Not on file   Years of education: Not on file   Highest education level: Not on file  Occupational History   Not on file  Tobacco Use   Smoking status: Never   Smokeless tobacco: Never  Vaping Use   Vaping Use: Never used  Substance and Sexual Activity  Alcohol use: No   Drug use: No   Sexual activity: Not Currently    Birth control/protection: Surgical  Other Topics Concern   Not on file  Social History Narrative   Not on file   Social Determinants of Health   Financial Resource Strain: Not on file  Food Insecurity: Not on file  Transportation Needs: Not on file  Physical Activity: Not on file  Stress: Not on file  Social Connections: Not on file  Intimate Partner Violence: Not on file    Review of Systems: See HPI, otherwise negative ROS  Physical Exam: BP (!) 174/69   Pulse 79   Temp 98.1 F (36.7 C) (Temporal)   Resp 16   Ht 5\' 1"  (1.549 m)   Wt 49 kg   SpO2 98%   BMI 20.41 kg/m  General:   Alert,  pleasant and cooperative in  NAD Head:  Normocephalic and atraumatic. Lungs:  Clear to auscultation.    Heart:  Regular rate and rhythm.   Impression/Plan: Tiffany Bruce is here for ophthalmic surgery.  Risks, benefits, limitations, and alternatives regarding ophthalmic surgery have been reviewed with the patient.  Questions have been answered.  All parties agreeable.   Leandrew Koyanagi, MD  05/18/2022, 7:44 AM

## 2022-05-18 NOTE — Anesthesia Procedure Notes (Signed)
Procedure Name: MAC Date/Time: 05/18/2022 8:17 AM  Performed by: Dionne Bucy, CRNAPre-anesthesia Checklist: Patient identified, Emergency Drugs available, Suction available, Patient being monitored and Timeout performed Patient Re-evaluated:Patient Re-evaluated prior to induction Oxygen Delivery Method: Nasal cannula Placement Confirmation: positive ETCO2

## 2022-05-19 ENCOUNTER — Encounter: Payer: Self-pay | Admitting: Ophthalmology

## 2022-05-23 ENCOUNTER — Encounter: Payer: Self-pay | Admitting: Ophthalmology

## 2022-05-27 DIAGNOSIS — H2512 Age-related nuclear cataract, left eye: Secondary | ICD-10-CM | POA: Diagnosis not present

## 2022-05-30 ENCOUNTER — Ambulatory Visit
Admission: RE | Admit: 2022-05-30 | Discharge: 2022-05-30 | Disposition: A | Discharge status: Home / Self Care | Payer: Medicare HMO | Source: Ambulatory Visit | Attending: Nurse Practitioner | Admitting: Nurse Practitioner

## 2022-05-30 DIAGNOSIS — Z1231 Encounter for screening mammogram for malignant neoplasm of breast: Secondary | ICD-10-CM | POA: Insufficient documentation

## 2022-06-01 ENCOUNTER — Encounter
Admission: RE | Disposition: A | Discharge status: Home / Self Care | Payer: Self-pay | Source: Home / Self Care | Attending: Ophthalmology

## 2022-06-01 ENCOUNTER — Ambulatory Visit: Payer: Medicare HMO | Admitting: Anesthesiology

## 2022-06-01 ENCOUNTER — Other Ambulatory Visit: Payer: Self-pay

## 2022-06-01 ENCOUNTER — Ambulatory Visit
Admission: RE | Admit: 2022-06-01 | Discharge: 2022-06-01 | Disposition: A | Discharge status: Home / Self Care | Payer: Medicare HMO | Attending: Ophthalmology | Admitting: Ophthalmology

## 2022-06-01 ENCOUNTER — Encounter: Payer: Self-pay | Admitting: Ophthalmology

## 2022-06-01 DIAGNOSIS — E039 Hypothyroidism, unspecified: Secondary | ICD-10-CM | POA: Insufficient documentation

## 2022-06-01 DIAGNOSIS — H2511 Age-related nuclear cataract, right eye: Secondary | ICD-10-CM | POA: Insufficient documentation

## 2022-06-01 DIAGNOSIS — H25811 Combined forms of age-related cataract, right eye: Secondary | ICD-10-CM | POA: Diagnosis not present

## 2022-06-01 HISTORY — PX: CATARACT EXTRACTION W/PHACO: SHX586

## 2022-06-01 SURGERY — PHACOEMULSIFICATION, CATARACT, WITH IOL INSERTION
Anesthesia: Monitor Anesthesia Care | Site: Eye | Laterality: Right

## 2022-06-01 MED ORDER — SIGHTPATH DOSE#1 BSS IO SOLN
INTRAOCULAR | Status: DC | PRN
  Administered 2022-06-01: 2 mL

## 2022-06-01 MED ORDER — TETRACAINE HCL 0.5 % OP SOLN
1.0000 [drp] | OPHTHALMIC | Status: DC | PRN
  Administered 2022-06-01 (×3): 1 [drp] via OPHTHALMIC

## 2022-06-01 MED ORDER — BRIMONIDINE TARTRATE-TIMOLOL 0.2-0.5 % OP SOLN
OPHTHALMIC | Status: DC | PRN
  Administered 2022-06-01: 1 [drp] via OPHTHALMIC

## 2022-06-01 MED ORDER — SIGHTPATH DOSE#1 BSS IO SOLN
INTRAOCULAR | Status: DC | PRN
  Administered 2022-06-01: 15 mL via INTRAOCULAR

## 2022-06-01 MED ORDER — ARMC OPHTHALMIC DILATING DROPS
1.0000 "application " | OPHTHALMIC | Status: DC | PRN
  Administered 2022-06-01 (×3): 1 via OPHTHALMIC

## 2022-06-01 MED ORDER — SIGHTPATH DOSE#1 BSS IO SOLN
INTRAOCULAR | Status: DC | PRN
  Administered 2022-06-01: 90 mL via OPHTHALMIC

## 2022-06-01 MED ORDER — FENTANYL CITRATE (PF) 100 MCG/2ML IJ SOLN
INTRAMUSCULAR | Status: DC | PRN
  Administered 2022-06-01: 50 ug via INTRAVENOUS

## 2022-06-01 MED ORDER — SIGHTPATH DOSE#1 NA HYALUR & NA CHOND-NA HYALUR IO KIT
PACK | INTRAOCULAR | Status: DC | PRN
  Administered 2022-06-01: 1 via OPHTHALMIC

## 2022-06-01 MED ORDER — ONDANSETRON HCL 4 MG/2ML IJ SOLN
INTRAMUSCULAR | Status: DC | PRN
  Administered 2022-06-01: 4 mg via INTRAVENOUS

## 2022-06-01 MED ORDER — LACTATED RINGERS IV SOLN: INTRAVENOUS | Status: DC

## 2022-06-01 MED ORDER — CEFUROXIME OPHTHALMIC INJECTION 1 MG/0.1 ML
INJECTION | OPHTHALMIC | Status: DC | PRN
  Administered 2022-06-01: 0.1 mL via INTRACAMERAL

## 2022-06-01 SURGICAL SUPPLY — 13 items
CANNULA ANT/CHMB 27G (MISCELLANEOUS) IMPLANT
CANNULA ANT/CHMB 27GA (MISCELLANEOUS) IMPLANT
CATARACT SUITE SIGHTPATH (MISCELLANEOUS) ×2 IMPLANT
FEE CATARACT SUITE SIGHTPATH (MISCELLANEOUS) ×1 IMPLANT
GLOVE SRG 8 PF TXTR STRL LF DI (GLOVE) ×1 IMPLANT
GLOVE SURG ENC TEXT LTX SZ7.5 (GLOVE) ×2 IMPLANT
GLOVE SURG UNDER POLY LF SZ8 (GLOVE) ×2
LENS IOL TECNIS EYHANCE 19.0 (Intraocular Lens) ×1 IMPLANT
NDL FILTER BLUNT 18X1 1/2 (NEEDLE) ×1 IMPLANT
NEEDLE FILTER BLUNT 18X 1/2SAF (NEEDLE) ×1
NEEDLE FILTER BLUNT 18X1 1/2 (NEEDLE) ×1 IMPLANT
SYR 3ML LL SCALE MARK (SYRINGE) ×2 IMPLANT
WATER STERILE IRR 250ML POUR (IV SOLUTION) ×2 IMPLANT

## 2022-06-01 NOTE — H&P (Signed)
[facility]   Primary Care Physician:  Sallyanne Kuster, NP Ophthalmologist: Dr. Lockie Mola  Pre-Procedure History & Physical: HPI:  Tiffany Bruce is a 88 y.o. female here for ophthalmic surgery.   Past Medical History:  Diagnosis Date   Abnormal heart rhythm    Cervical lesion    Hypothyroidism    Osteoporosis    Vaginal atrophy    Vaginal atrophy    Wears dentures    full upper, partial lower    Past Surgical History:  Procedure Laterality Date   ABDOMINAL HYSTERECTOMY     ANTERIOR AND POSTERIOR VAGINAL REPAIR  12/15/2014,2010   CATARACT EXTRACTION W/PHACO Left 05/18/2022   Procedure: CATARACT EXTRACTION PHACO AND INTRAOCULAR LENS PLACEMENT (IOC) LEFT 6.84 01:02.3;  Surgeon: Lockie Mola, MD;  Location: [facility];  Service: Ophthalmology;  Laterality: Left;   ENTEROCELE REPAIR     TONSILLECTOMY     TUBAL LIGATION      Prior to Admission medications   Medication Sig Start Date End Date Taking? Authorizing Provider  Ascorbic Acid (VITAMIN C) 100 MG tablet Take 100 mg by mouth daily.   Yes [provider]  calcium-vitamin D (OSCAL WITH D) 250-125 MG-UNIT per tablet Take 1 tablet by mouth daily.   Yes [provider]  carboxymethylcellulose (REFRESH PLUS) 0.5 % SOLN 1 drop 3 (three) times daily as needed.   Yes [provider]  Carboxymethylcellulose Sodium (DRY EYE RELIEF OP) Apply to eye as needed.   Yes [provider]  docusate sodium (COLACE) 100 MG capsule Take 100 mg by mouth 2 (two) times daily.   Yes [provider]  fluticasone (FLONASE) 50 MCG/ACT nasal spray Place 2 sprays into both nostrils daily. 05/10/21  Yes Sallyanne Kuster, NP  levothyroxine (SYNTHROID) 75 MCG tablet Take 1 tablet (75 mcg total) by mouth daily before breakfast. 05/09/22  Yes Abernathy, Alyssa, NP  MAGNESIUM PO Take 250 mg by mouth daily.   Yes [provider]  Multiple Vitamins-Minerals (ALIVE MULTI-VITAMIN  PO) Take by mouth daily.   Yes [provider]  polycarbophil (FIBERCON) 625 MG tablet Take 625 mg by mouth daily.   Yes [provider]  vitamin A 12878 UNIT capsule Take 10,000 Units by mouth daily.   Yes [provider]  vitamin B-12 (CYANOCOBALAMIN) 100 MCG tablet Take 100 mcg by mouth daily.   Yes [provider]  vitamin E 100 UNIT capsule Take by mouth daily.   Yes [provider]  zinc gluconate 50 MG tablet Take 50 mg by mouth daily.   Yes [provider]  azelastine (ASTELIN) 0.1 % nasal spray Place 2 sprays into both nostrils 2 (two) times daily. 05/10/21   Sallyanne Kuster, NP  Eyelid Cleansers (OCUSOFT EYELID CLEANSING EX) Apply topically as needed.    [provider]  Tdap (BOOSTRIX) 5-2.5-18.5 LF-MCG/0.5 injection Inject 0.5 mLs into the muscle once.    [provider]    Allergies as of 05/20/2022   (No Known Allergies)    Family History  Problem Relation Age of Onset   Cancer Neg Hx    Diabetes Neg Hx    Heart disease Neg Hx    Breast cancer Neg Hx     Social History   Socioeconomic History   Marital status: Married    Spouse name: Not on file   Number of children: Not on file   Years of education: Not on file   Highest education level: Not on  file  Occupational History   Not on file  Tobacco Use   Smoking status: Never   Smokeless tobacco: Never  Vaping Use   Vaping Use: Never used  Substance and Sexual Activity   Alcohol use: No   Drug use: No   Sexual activity: Not Currently    Birth control/protection: Surgical  Other Topics Concern   Not on file  Social History Narrative   Not on file   Social Determinants of Health   Financial Resource Strain: Not on file  Food Insecurity: Not on file  Transportation Needs: Not on file  Physical Activity: Not on file  Stress: Not on file  Social Connections: Not on file  Intimate Partner Violence: Not on file    Review of  Systems: See HPI, otherwise negative ROS  Physical Exam: BP (!) 153/57   Pulse 72   Ht 5\' 1"  (1.549 m)   Wt 50.6 kg   SpO2 96%   BMI 21.09 kg/m  General:   Alert,  pleasant and cooperative in NAD Head:  Normocephalic and atraumatic. Lungs:  Clear to auscultation.    Heart:  Regular rate and rhythm.   Impression/Plan: Tiffany Bruce is here for ophthalmic surgery.  Risks, benefits, limitations, and alternatives regarding ophthalmic surgery have been reviewed with the patient.  Questions have been answered.  All parties agreeable.   Nicholaus Corolla, MD  06/01/2022, 7:30 AM

## 2022-06-01 NOTE — Anesthesia Postprocedure Evaluation (Signed)
Anesthesia Post Note  Patient: Tiffany Bruce  Procedure(s) Performed: CATARACT EXTRACTION PHACO AND INTRAOCULAR LENS PLACEMENT (IOC) Right 7.12 01:22.1 (Right: Eye)     Patient location during evaluation: PACU Anesthesia Type: MAC Level of consciousness: awake and alert Pain management: pain level controlled Vital Signs Assessment: post-procedure vital signs reviewed and stable Respiratory status: spontaneous breathing, nonlabored ventilation, respiratory function stable and patient connected to nasal cannula oxygen Cardiovascular status: stable and blood pressure returned to baseline Postop Assessment: no apparent nausea or vomiting Anesthetic complications: no   No notable events documented.  Trecia Rogers

## 2022-06-01 NOTE — Anesthesia Preprocedure Evaluation (Addendum)
Anesthesia Evaluation  Patient identified by MRN, date of birth, ID band Patient awake    Reviewed: Allergy & Precautions, H&P , NPO status , Patient's Chart, lab work & pertinent test results, reviewed documented beta blocker date and time   Airway Mallampati: IV  TM Distance: >3 FB Neck ROM: full    Dental  (+) Upper Dentures, Partial Lower   Pulmonary neg pulmonary ROS,    Pulmonary exam normal breath sounds clear to auscultation       Cardiovascular Exercise Tolerance: Good negative cardio ROS Normal cardiovascular exam Rhythm:regular Rate:Normal     Neuro/Psych negative neurological ROS  negative psych ROS   GI/Hepatic negative GI ROS, Neg liver ROS,   Endo/Other  Hypothyroidism   Renal/GU negative Renal ROS  negative genitourinary   Musculoskeletal   Abdominal   Peds  Hematology negative hematology ROS (+)   Anesthesia Other Findings   Reproductive/Obstetrics negative OB ROS                            Anesthesia Physical Anesthesia Plan  ASA: 2  Anesthesia Plan: MAC   Post-op Pain Management:    Induction:   PONV Risk Score and Plan:   Airway Management Planned:   Additional Equipment:   Intra-op Plan:   Post-operative Plan:   Informed Consent: I have reviewed the patients History and Physical, chart, labs and discussed the procedure including the risks, benefits and alternatives for the proposed anesthesia with the patient or authorized representative who has indicated his/her understanding and acceptance.     Dental Advisory Given  Plan Discussed with: CRNA and Anesthesiologist  Anesthesia Plan Comments:         Anesthesia Quick Evaluation

## 2022-06-01 NOTE — Op Note (Signed)
LOCATION:  [facility]   PREOPERATIVE DIAGNOSIS:    Nuclear sclerotic cataract right eye. H25.11   POSTOPERATIVE DIAGNOSIS:  Nuclear sclerotic cataract right eye.     PROCEDURE:  Phacoemusification with posterior chamber intraocular lens placement of the right eye   ULTRASOUND TIME: Procedure(s): CATARACT EXTRACTION PHACO AND INTRAOCULAR LENS PLACEMENT (IOC) Right 7.12 01:22.1 (Right)  LENS:   Implant Name Type Inv. Item Serial No. Manufacturer Lot No. LRB No. Used Action  LENS IOL TECNIS EYHANCE 19.0 - [id number] Intraocular Lens LENS IOL TECNIS EYHANCE 19.0 [id number] [employer]  Right 1 Implanted         SURGEON:  Deirdre Evener, MD   ANESTHESIA:  Topical with tetracaine drops and 2% Xylocaine jelly, augmented with 1% preservative-free intracameral lidocaine.    COMPLICATIONS:  None.   DESCRIPTION OF PROCEDURE:  The patient was identified in the holding room and transported to the operating room and placed in the supine position under the operating microscope.  The right eye was identified as the operative eye and it was prepped and draped in the usual sterile ophthalmic fashion.   A 1 millimeter clear-corneal paracentesis was made at the 12:00 position.  0.5 ml of preservative-free 1% lidocaine was injected into the anterior chamber. The anterior chamber was filled with Viscoat viscoelastic.  A 2.4 millimeter keratome was used to make a near-clear corneal incision at the 9:00 position.  A curvilinear capsulorrhexis was made with a cystotome and capsulorrhexis forceps.  Balanced salt solution was used to hydrodissect and hydrodelineate the nucleus.   Phacoemulsification was then used in stop and chop fashion to remove the lens nucleus and epinucleus.  The remaining cortex was then removed using the irrigation and aspiration handpiece. Provisc was then placed into the capsular bag to distend it for lens placement.  A lens was then injected into the capsular bag.  The  remaining viscoelastic was aspirated.   Wounds were hydrated with balanced salt solution.  The anterior chamber was inflated to a physiologic pressure with balanced salt solution.  No wound leaks were noted. Cefuroxime 0.1 ml of a 10mg /ml solution was injected into the anterior chamber for a dose of 1 mg of intracameral antibiotic at the completion of the case.   Timolol and Brimonidine drops were applied to the eye.  The patient was taken to the recovery room in stable condition without complications of anesthesia or surgery.   Giani Winther 06/01/2022, 8:21 AM

## 2022-06-01 NOTE — Anesthesia Procedure Notes (Signed)
Procedure Name: MAC Date/Time: 06/01/2022 8:06 AM  Performed by: Jeannene Patella, CRNAPre-anesthesia Checklist: Patient identified, Emergency Drugs available, Suction available, Timeout performed and Patient being monitored Patient Re-evaluated:Patient Re-evaluated prior to induction Oxygen Delivery Method: Nasal cannula Placement Confirmation: positive ETCO2

## 2022-06-01 NOTE — Transfer of Care (Signed)
Immediate Anesthesia Transfer of Care Note  Patient: Tiffany Bruce  Procedure(s) Performed: CATARACT EXTRACTION PHACO AND INTRAOCULAR LENS PLACEMENT (IOC) Right 7.12 01:22.1 (Right: Eye)  Patient Location: PACU  Anesthesia Type: MAC  Level of Consciousness: awake, alert  and patient cooperative  Airway and Oxygen Therapy: Patient Spontanous Breathing and Patient connected to supplemental oxygen  Post-op Assessment: Post-op Vital signs reviewed, Patient's Cardiovascular Status Stable, Respiratory Function Stable, Patent Airway and No signs of Nausea or vomiting  Post-op Vital Signs: Reviewed and stable  Complications: No notable events documented.

## 2022-06-23 ENCOUNTER — Encounter: Payer: Self-pay | Admitting: Nurse Practitioner

## 2022-07-11 ENCOUNTER — Telehealth: Payer: Self-pay

## 2022-07-11 NOTE — Telephone Encounter (Signed)
-----   Message from Lyndon Code, MD sent at 07/10/2022  8:01 AM EDT ----- Please let the pt know, might have to go down on her synthroid to 50 mcg, please ask her how is she doing

## 2022-07-11 NOTE — Telephone Encounter (Signed)
LMOM about decreasing her synthroid to 50 MCG per DFK and also seeing how pt was doing.  Advised to call us back

## 2022-07-12 ENCOUNTER — Other Ambulatory Visit: Payer: Self-pay

## 2022-07-12 ENCOUNTER — Telehealth: Payer: Self-pay

## 2022-07-12 MED ORDER — LEVOTHYROXINE SODIUM 50 MCG PO TABS: 50.0000 ug | ORAL_TABLET | Freq: Every day | ORAL | 1 refills | Status: DC

## 2022-07-12 NOTE — Telephone Encounter (Signed)
Spoke to pt and advised that Dr Welton Flakes wanted to see how pt was doing and that we may have to change her levothyroxine 75 mcg to 50 mcg, I informed pt that we will send a new rx of 50 mcg

## 2022-07-14 NOTE — Telephone Encounter (Signed)
Spoke to pt and we decreased her levothyroxine to 50 mcg per Dr Welton Flakes.  Sent new rx to her pharmacy

## 2022-07-22 DIAGNOSIS — H524 Presbyopia: Secondary | ICD-10-CM | POA: Diagnosis not present

## 2022-10-07 DIAGNOSIS — M3501 Sicca syndrome with keratoconjunctivitis: Secondary | ICD-10-CM | POA: Diagnosis not present

## 2022-11-07 ENCOUNTER — Ambulatory Visit (INDEPENDENT_AMBULATORY_CARE_PROVIDER_SITE_OTHER): Payer: Medicare HMO | Admitting: Nurse Practitioner

## 2022-11-07 ENCOUNTER — Encounter: Payer: Self-pay | Admitting: Nurse Practitioner

## 2022-11-07 VITALS — BP 138/72 | HR 82 | Temp 98.3°F | Resp 16 | Ht 62.0 in | Wt 116.0 lb

## 2022-11-07 DIAGNOSIS — E039 Hypothyroidism, unspecified: Secondary | ICD-10-CM

## 2022-11-07 DIAGNOSIS — R030 Elevated blood-pressure reading, without diagnosis of hypertension: Secondary | ICD-10-CM | POA: Diagnosis not present

## 2022-11-07 DIAGNOSIS — Z2911 Encounter for prophylactic immunotherapy for respiratory syncytial virus (RSV): Secondary | ICD-10-CM | POA: Diagnosis not present

## 2022-11-07 MED ORDER — AREXVY 120 MCG/0.5ML IM SUSR: 0.5000 mL | Freq: Once | INTRAMUSCULAR | 0 refills | Status: AC

## 2022-11-07 NOTE — Progress Notes (Signed)
[facility] [address] [place], [place] [address]  Internal MEDICINE  Office Visit Note  Patient Name: Tiffany Bruce  [id number]  [id number]  Date of Service: 11/07/2022  Chief Complaint  Patient presents with   Follow-up    HPI Stephan presents for a follow up visit for vaccinations, hypothyroidism and hypertension.  Updated vaccination records -- added dates for flu and covid, wants to get RSV vaccine BP elevated, improved when rechecked Need to repeat thyroid labs -- have not done this since her dose was decreased to 50 mcg daily.     Current Medication: Outpatient Encounter Medications as of 11/07/2022  Medication Sig   Ascorbic Acid (VITAMIN C) 100 MG tablet Take 100 mg by mouth daily.   azelastine (ASTELIN) 0.1 % nasal spray Place 2 sprays into both nostrils 2 (two) times daily.   calcium-vitamin D (OSCAL WITH D) 250-125 MG-UNIT per tablet Take 1 tablet by mouth daily.   carboxymethylcellulose (REFRESH PLUS) 0.5 % SOLN 1 drop 3 (three) times daily as needed.   Carboxymethylcellulose Sodium (DRY EYE RELIEF OP) Apply to eye as needed.   docusate sodium (COLACE) 100 MG capsule Take 100 mg by mouth 2 (two) times daily.   Eyelid Cleansers (OCUSOFT EYELID CLEANSING EX) Apply topically as needed.   fluticasone (FLONASE) 50 MCG/ACT nasal spray Place 2 sprays into both nostrils daily.   levothyroxine (SYNTHROID) 50 MCG tablet Take 1 tablet (50 mcg total) by mouth daily before breakfast.   MAGNESIUM PO Take 250 mg by mouth daily.   Multiple Vitamins-Minerals (ALIVE MULTI-VITAMIN PO) Take by mouth daily.   polycarbophil (FIBERCON) 625 MG tablet Take 625 mg by mouth daily.   RSV vaccine recomb adjuvanted (AREXVY) 120 MCG/0.5ML injection Inject 0.5 mLs into the muscle once for 1 dose.   Tdap (BOOSTRIX) 5-2.5-18.5 LF-MCG/0.5 injection Inject 0.5 mLs into the muscle once.   vitamin A 95188 UNIT capsule Take 10,000 Units by mouth daily.   vitamin B-12 (CYANOCOBALAMIN) 100  MCG tablet Take 100 mcg by mouth daily.   vitamin E 100 UNIT capsule Take by mouth daily.   zinc gluconate 50 MG tablet Take 50 mg by mouth daily.   [DISCONTINUED] levothyroxine (SYNTHROID) 75 MCG tablet Take 1 tablet (75 mcg total) by mouth daily before breakfast.   No facility-administered encounter medications on file as of 11/07/2022.    Surgical History: Past Surgical History:  Procedure Laterality Date   ABDOMINAL HYSTERECTOMY     ANTERIOR AND POSTERIOR VAGINAL REPAIR  12/15/2014,2010   CATARACT EXTRACTION W/PHACO Left 05/18/2022   Procedure: CATARACT EXTRACTION PHACO AND INTRAOCULAR LENS PLACEMENT (IOC) LEFT 6.84 01:02.3;  Surgeon: Lockie Mola, MD;  Location: [facility];  Service: Ophthalmology;  Laterality: Left;   CATARACT EXTRACTION W/PHACO Right 06/01/2022   Procedure: CATARACT EXTRACTION PHACO AND INTRAOCULAR LENS PLACEMENT (IOC) Right 7.12 01:22.1;  Surgeon: Lockie Mola, MD;  Location: [facility];  Service: Ophthalmology;  Laterality: Right;   ENTEROCELE REPAIR     TONSILLECTOMY     TUBAL LIGATION      Medical History: Past Medical History:  Diagnosis Date   Abnormal heart rhythm    Cervical lesion    Hypothyroidism    Osteoporosis    Vaginal atrophy    Vaginal atrophy    Wears dentures    full upper, partial lower    Family History: Family History  Problem Relation Age of Onset   Cancer Neg Hx    Diabetes Neg Hx  Heart disease Neg Hx    Breast cancer Neg Hx     Social History   Socioeconomic History   Marital status: Married    Spouse name: Not on file   Number of children: Not on file   Years of education: Not on file   Highest education level: Not on file  Occupational History   Not on file  Tobacco Use   Smoking status: Never   Smokeless tobacco: Never  Vaping Use   Vaping Use: Never used  Substance and Sexual Activity   Alcohol use: No   Drug use: No   Sexual activity: Not Currently    Birth  control/protection: Surgical  Other Topics Concern   Not on file  Social History Narrative   Not on file   Social Determinants of Health   Financial Resource Strain: Not on file  Food Insecurity: Not on file  Transportation Needs: Not on file  Physical Activity: Not on file  Stress: Not on file  Social Connections: Not on file  Intimate Partner Violence: Not on file      Review of Systems  Constitutional:  Negative for chills, fatigue and unexpected weight change.  HENT:  Negative for congestion, rhinorrhea, sneezing and sore throat.   Eyes:  Negative for redness.  Respiratory:  Negative for cough, chest tightness and shortness of breath.   Cardiovascular:  Negative for chest pain and palpitations.  Gastrointestinal:  Negative for abdominal pain, constipation, diarrhea, nausea and vomiting.  Genitourinary:  Negative for dysuria and frequency.  Musculoskeletal:  Negative for arthralgias, back pain, joint swelling and neck pain.  Skin:  Negative for rash.  Neurological: Negative.  Negative for tremors and numbness.  Hematological:  Negative for adenopathy. Does not bruise/bleed easily.  Psychiatric/Behavioral:  Negative for behavioral problems (Depression), sleep disturbance and suicidal ideas. The patient is not nervous/anxious.     Vital Signs: BP 138/72   Pulse 82   Temp 98.3 F (36.8 C)   Resp 16   Ht 5\' 2"  (1.575 m)   Wt 116 lb (52.6 kg)   SpO2 98%   BMI 21.22 kg/m    Physical Exam Vitals reviewed.  Constitutional:      General: She is not in acute distress.    Appearance: Normal appearance. She is normal weight. She is not ill-appearing.  HENT:     Head: Normocephalic and atraumatic.  Eyes:     Pupils: Pupils are equal, round, and reactive to light.  Cardiovascular:     Rate and Rhythm: Normal rate and regular rhythm.  Neurological:     Mental Status: She is alert and oriented to person, place, and time.     Cranial Nerves: No cranial nerve deficit.      Coordination: Coordination normal.     Gait: Gait normal.  Psychiatric:        Mood and Affect: Mood normal.        Behavior: Behavior normal.        Assessment/Plan: 1. Acquired hypothyroidism Repeat thyroid labs, will adjust levothyroxine dose accordingly.  - TSH + free T4  2. White coat syndrome without diagnosis of hypertension BP stable, improved when rechecked  3. Need for prophylactic vaccination and inoculation against respiratory syncytial virus (RSV) - RSV vaccine recomb adjuvanted (AREXVY) 120 MCG/0.5ML injection; Inject 0.5 mLs into the muscle once for 1 dose.  Dispense: 0.5 mL; Refill: 0   General Counseling: Johnika verbalizes understanding of the findings of todays visit and agrees with plan  of treatment. I have discussed any further diagnostic evaluation that may be needed or ordered today. We also reviewed her medications today. she has been encouraged to call the office with any questions or concerns that should arise related to todays visit.    Orders Placed This Encounter  Procedures   TSH + free T4    Meds ordered this encounter  Medications   RSV vaccine recomb adjuvanted (AREXVY) 120 MCG/0.5ML injection    Sig: Inject 0.5 mLs into the muscle once for 1 dose.    Dispense:  0.5 mL    Refill:  0    Return in 6 months (on 05/09/2023) for previously scheduled, CPE, Patrici Minnis PCP.   Total time spent:30 Minutes Time spent includes review of chart, medications, test results, and follow up plan with the patient.   [place] Controlled Substance Database was reviewed by me.  This patient was seen by Sallyanne Kuster, FNP-C in collaboration with Dr. Beverely Risen as a part of collaborative care agreement.   Kinleigh Nault R. Tedd Sias, MSN, FNP-C Internal medicine

## 2022-11-08 LAB — TSH+FREE T4
Free T4: 0.94 ng/dL (ref 0.82–1.77)
TSH: 11.2 u[IU]/mL — ABNORMAL HIGH (ref 0.450–4.500)

## 2022-11-11 ENCOUNTER — Other Ambulatory Visit: Payer: Self-pay | Admitting: Nurse Practitioner

## 2022-11-11 ENCOUNTER — Telehealth: Payer: Self-pay

## 2022-11-11 DIAGNOSIS — E039 Hypothyroidism, unspecified: Secondary | ICD-10-CM

## 2022-11-11 MED ORDER — LEVOTHYROXINE SODIUM 88 MCG PO TABS: 88.0000 ug | ORAL_TABLET | Freq: Every day | ORAL | 2 refills | Status: DC

## 2022-11-11 NOTE — Telephone Encounter (Signed)
Patient notified

## 2022-11-11 NOTE — Progress Notes (Signed)
Please let patient know that her TSH level has increased to 11. I have discontinued her 50 mcg levothyroxine and have already ordered levothyroxine 88 mcg daily and have ordered repeat labs TSH and free T4 to be done 6 weeks after starting the new dose please.

## 2022-11-11 NOTE — Telephone Encounter (Signed)
-----   Message from Sallyanne Kuster, NP sent at 11/11/2022  1:11 PM EST ----- Please let patient know that her TSH level has increased to 11. I have discontinued her 50 mcg levothyroxine and have already ordered levothyroxine 88 mcg daily and have ordered repeat labs TSH and free T4 to be done 6 weeks after starting the new dose please.

## 2022-12-16 ENCOUNTER — Telehealth: Payer: Self-pay

## 2022-12-16 NOTE — Telephone Encounter (Signed)
Patient called asking was she suppose to be taking the Synthroid 88 mcg still. As per Alyssa she is and suppose to have 2 refills at pharmacy, patient will call her pharmacy to see if she has any refills and if not will give [place] a call back.

## 2022-12-26 DIAGNOSIS — H04123 Dry eye syndrome of bilateral lacrimal glands: Secondary | ICD-10-CM | POA: Diagnosis not present

## 2022-12-26 DIAGNOSIS — Z961 Presence of intraocular lens: Secondary | ICD-10-CM | POA: Diagnosis not present

## 2022-12-26 DIAGNOSIS — H353131 Nonexudative age-related macular degeneration, bilateral, early dry stage: Secondary | ICD-10-CM | POA: Diagnosis not present

## 2023-01-09 ENCOUNTER — Other Ambulatory Visit: Payer: Self-pay | Admitting: Nurse Practitioner

## 2023-01-23 ENCOUNTER — Telehealth: Payer: Self-pay

## 2023-01-24 ENCOUNTER — Telehealth: Payer: Self-pay

## 2023-01-24 NOTE — Telephone Encounter (Signed)
Done

## 2023-01-24 NOTE — Telephone Encounter (Signed)
Left message for patient to give office a call back.

## 2023-01-27 DIAGNOSIS — E039 Hypothyroidism, unspecified: Secondary | ICD-10-CM | POA: Diagnosis not present

## 2023-01-28 LAB — TSH+FREE T4
Free T4: 1.97 ng/dL — ABNORMAL HIGH (ref 0.82–1.77)
TSH: 0.024 u[IU]/mL — ABNORMAL LOW (ref 0.450–4.500)

## 2023-02-02 ENCOUNTER — Other Ambulatory Visit: Payer: Self-pay | Admitting: Nurse Practitioner

## 2023-02-02 DIAGNOSIS — E039 Hypothyroidism, unspecified: Secondary | ICD-10-CM

## 2023-02-02 MED ORDER — LEVOTHYROXINE SODIUM 75 MCG PO TABS: 75.0000 ug | ORAL_TABLET | Freq: Every day | ORAL | 0 refills | Status: DC

## 2023-02-02 NOTE — Progress Notes (Signed)
Please call patient and let her know that her TSH is too low and her free T4 is now too high so we need to decrease her levothyroxine dose to 75 mcg daily. I have sent the prescription to her pharmacy. She will need to have her lab checked again in 6 weeks which will be around April 11th or after. I have already ordered the lab as well

## 2023-02-03 ENCOUNTER — Telehealth: Payer: Self-pay

## 2023-02-03 NOTE — Telephone Encounter (Signed)
-----   Message from Jonetta Osgood, NP sent at 02/02/2023 10:11 AM EST ----- Please call patient and let her know that her TSH is too low and her free T4 is now too high so we need to decrease her levothyroxine dose to 75 mcg daily. I have sent the prescription to her pharmacy. She will need to have her lab checked again in 6 weeks which will be around April 11th or after. I have already ordered the lab as well

## 2023-02-03 NOTE — Telephone Encounter (Signed)
Pt notified for labs she had levothyroxine 75 mcg at home she will start

## 2023-02-15 DIAGNOSIS — M62830 Muscle spasm of back: Secondary | ICD-10-CM | POA: Diagnosis not present

## 2023-02-15 DIAGNOSIS — M9903 Segmental and somatic dysfunction of lumbar region: Secondary | ICD-10-CM | POA: Diagnosis not present

## 2023-02-15 DIAGNOSIS — M5416 Radiculopathy, lumbar region: Secondary | ICD-10-CM | POA: Diagnosis not present

## 2023-02-15 DIAGNOSIS — M9905 Segmental and somatic dysfunction of pelvic region: Secondary | ICD-10-CM | POA: Diagnosis not present

## 2023-02-20 DIAGNOSIS — M9905 Segmental and somatic dysfunction of pelvic region: Secondary | ICD-10-CM | POA: Diagnosis not present

## 2023-02-20 DIAGNOSIS — M5416 Radiculopathy, lumbar region: Secondary | ICD-10-CM | POA: Diagnosis not present

## 2023-02-20 DIAGNOSIS — M9903 Segmental and somatic dysfunction of lumbar region: Secondary | ICD-10-CM | POA: Diagnosis not present

## 2023-02-20 DIAGNOSIS — M62830 Muscle spasm of back: Secondary | ICD-10-CM | POA: Diagnosis not present

## 2023-02-22 DIAGNOSIS — M9905 Segmental and somatic dysfunction of pelvic region: Secondary | ICD-10-CM | POA: Diagnosis not present

## 2023-02-22 DIAGNOSIS — M5416 Radiculopathy, lumbar region: Secondary | ICD-10-CM | POA: Diagnosis not present

## 2023-02-22 DIAGNOSIS — M62830 Muscle spasm of back: Secondary | ICD-10-CM | POA: Diagnosis not present

## 2023-02-22 DIAGNOSIS — M9903 Segmental and somatic dysfunction of lumbar region: Secondary | ICD-10-CM | POA: Diagnosis not present

## 2023-02-27 DIAGNOSIS — M9903 Segmental and somatic dysfunction of lumbar region: Secondary | ICD-10-CM | POA: Diagnosis not present

## 2023-02-27 DIAGNOSIS — M5416 Radiculopathy, lumbar region: Secondary | ICD-10-CM | POA: Diagnosis not present

## 2023-02-27 DIAGNOSIS — M62830 Muscle spasm of back: Secondary | ICD-10-CM | POA: Diagnosis not present

## 2023-02-27 DIAGNOSIS — M9905 Segmental and somatic dysfunction of pelvic region: Secondary | ICD-10-CM | POA: Diagnosis not present

## 2023-03-01 DIAGNOSIS — M5416 Radiculopathy, lumbar region: Secondary | ICD-10-CM | POA: Diagnosis not present

## 2023-03-01 DIAGNOSIS — M62830 Muscle spasm of back: Secondary | ICD-10-CM | POA: Diagnosis not present

## 2023-03-01 DIAGNOSIS — M9905 Segmental and somatic dysfunction of pelvic region: Secondary | ICD-10-CM | POA: Diagnosis not present

## 2023-03-01 DIAGNOSIS — M9903 Segmental and somatic dysfunction of lumbar region: Secondary | ICD-10-CM | POA: Diagnosis not present

## 2023-03-06 DIAGNOSIS — M62830 Muscle spasm of back: Secondary | ICD-10-CM | POA: Diagnosis not present

## 2023-03-06 DIAGNOSIS — M9903 Segmental and somatic dysfunction of lumbar region: Secondary | ICD-10-CM | POA: Diagnosis not present

## 2023-03-06 DIAGNOSIS — M5416 Radiculopathy, lumbar region: Secondary | ICD-10-CM | POA: Diagnosis not present

## 2023-03-06 DIAGNOSIS — M9905 Segmental and somatic dysfunction of pelvic region: Secondary | ICD-10-CM | POA: Diagnosis not present

## 2023-03-08 DIAGNOSIS — M9903 Segmental and somatic dysfunction of lumbar region: Secondary | ICD-10-CM | POA: Diagnosis not present

## 2023-03-08 DIAGNOSIS — M9905 Segmental and somatic dysfunction of pelvic region: Secondary | ICD-10-CM | POA: Diagnosis not present

## 2023-03-08 DIAGNOSIS — M62830 Muscle spasm of back: Secondary | ICD-10-CM | POA: Diagnosis not present

## 2023-03-08 DIAGNOSIS — M5416 Radiculopathy, lumbar region: Secondary | ICD-10-CM | POA: Diagnosis not present

## 2023-03-10 DIAGNOSIS — M62830 Muscle spasm of back: Secondary | ICD-10-CM | POA: Diagnosis not present

## 2023-03-10 DIAGNOSIS — M9903 Segmental and somatic dysfunction of lumbar region: Secondary | ICD-10-CM | POA: Diagnosis not present

## 2023-03-10 DIAGNOSIS — M9905 Segmental and somatic dysfunction of pelvic region: Secondary | ICD-10-CM | POA: Diagnosis not present

## 2023-03-10 DIAGNOSIS — M5416 Radiculopathy, lumbar region: Secondary | ICD-10-CM | POA: Diagnosis not present

## 2023-03-13 DIAGNOSIS — M9905 Segmental and somatic dysfunction of pelvic region: Secondary | ICD-10-CM | POA: Diagnosis not present

## 2023-03-13 DIAGNOSIS — M5416 Radiculopathy, lumbar region: Secondary | ICD-10-CM | POA: Diagnosis not present

## 2023-03-13 DIAGNOSIS — M62830 Muscle spasm of back: Secondary | ICD-10-CM | POA: Diagnosis not present

## 2023-03-13 DIAGNOSIS — M9903 Segmental and somatic dysfunction of lumbar region: Secondary | ICD-10-CM | POA: Diagnosis not present

## 2023-03-15 DIAGNOSIS — M5416 Radiculopathy, lumbar region: Secondary | ICD-10-CM | POA: Diagnosis not present

## 2023-03-15 DIAGNOSIS — M9905 Segmental and somatic dysfunction of pelvic region: Secondary | ICD-10-CM | POA: Diagnosis not present

## 2023-03-15 DIAGNOSIS — M9903 Segmental and somatic dysfunction of lumbar region: Secondary | ICD-10-CM | POA: Diagnosis not present

## 2023-03-15 DIAGNOSIS — M62830 Muscle spasm of back: Secondary | ICD-10-CM | POA: Diagnosis not present

## 2023-03-16 DIAGNOSIS — S36438A Laceration of other part of small intestine, initial encounter: Secondary | ICD-10-CM | POA: Diagnosis not present

## 2023-03-16 DIAGNOSIS — I7774 Dissection of vertebral artery: Secondary | ICD-10-CM | POA: Diagnosis not present

## 2023-03-16 DIAGNOSIS — K9172 Accidental puncture and laceration of a digestive system organ or structure during other procedure: Secondary | ICD-10-CM | POA: Diagnosis not present

## 2023-03-16 DIAGNOSIS — R682 Dry mouth, unspecified: Secondary | ICD-10-CM | POA: Diagnosis not present

## 2023-03-16 DIAGNOSIS — S27322A Contusion of lung, bilateral, initial encounter: Secondary | ICD-10-CM | POA: Diagnosis not present

## 2023-03-16 DIAGNOSIS — S52252A Displaced comminuted fracture of shaft of ulna, left arm, initial encounter for closed fracture: Secondary | ICD-10-CM | POA: Diagnosis not present

## 2023-03-16 DIAGNOSIS — F419 Anxiety disorder, unspecified: Secondary | ICD-10-CM | POA: Diagnosis not present

## 2023-03-16 DIAGNOSIS — S2243XA Multiple fractures of ribs, bilateral, initial encounter for closed fracture: Secondary | ICD-10-CM | POA: Diagnosis not present

## 2023-03-16 DIAGNOSIS — S51811A Laceration without foreign body of right forearm, initial encounter: Secondary | ICD-10-CM | POA: Diagnosis not present

## 2023-03-16 DIAGNOSIS — S3993XA Unspecified injury of pelvis, initial encounter: Secondary | ICD-10-CM | POA: Diagnosis not present

## 2023-03-16 DIAGNOSIS — S22088A Other fracture of T11-T12 vertebra, initial encounter for closed fracture: Secondary | ICD-10-CM | POA: Diagnosis not present

## 2023-03-16 DIAGNOSIS — T07XXXD Unspecified multiple injuries, subsequent encounter: Secondary | ICD-10-CM | POA: Diagnosis not present

## 2023-03-16 DIAGNOSIS — K558 Other vascular disorders of intestine: Secondary | ICD-10-CM | POA: Diagnosis not present

## 2023-03-16 DIAGNOSIS — S15191A Other specified injury of right vertebral artery, initial encounter: Secondary | ICD-10-CM | POA: Diagnosis not present

## 2023-03-16 DIAGNOSIS — N390 Urinary tract infection, site not specified: Secondary | ICD-10-CM | POA: Diagnosis not present

## 2023-03-16 DIAGNOSIS — R1312 Dysphagia, oropharyngeal phase: Secondary | ICD-10-CM | POA: Diagnosis not present

## 2023-03-16 DIAGNOSIS — J90 Pleural effusion, not elsewhere classified: Secondary | ICD-10-CM | POA: Diagnosis not present

## 2023-03-16 DIAGNOSIS — J9600 Acute respiratory failure, unspecified whether with hypoxia or hypercapnia: Secondary | ICD-10-CM | POA: Diagnosis not present

## 2023-03-16 DIAGNOSIS — S42302A Unspecified fracture of shaft of humerus, left arm, initial encounter for closed fracture: Secondary | ICD-10-CM | POA: Diagnosis not present

## 2023-03-16 DIAGNOSIS — S20312A Abrasion of left front wall of thorax, initial encounter: Secondary | ICD-10-CM | POA: Diagnosis not present

## 2023-03-16 DIAGNOSIS — S0990XA Unspecified injury of head, initial encounter: Secondary | ICD-10-CM | POA: Diagnosis not present

## 2023-03-16 DIAGNOSIS — S0993XA Unspecified injury of face, initial encounter: Secondary | ICD-10-CM | POA: Diagnosis not present

## 2023-03-16 DIAGNOSIS — R112 Nausea with vomiting, unspecified: Secondary | ICD-10-CM | POA: Diagnosis not present

## 2023-03-16 DIAGNOSIS — K6389 Other specified diseases of intestine: Secondary | ICD-10-CM | POA: Diagnosis not present

## 2023-03-16 DIAGNOSIS — R0600 Dyspnea, unspecified: Secondary | ICD-10-CM | POA: Diagnosis not present

## 2023-03-16 DIAGNOSIS — M25552 Pain in left hip: Secondary | ICD-10-CM | POA: Diagnosis not present

## 2023-03-16 DIAGNOSIS — Z4682 Encounter for fitting and adjustment of non-vascular catheter: Secondary | ICD-10-CM | POA: Diagnosis not present

## 2023-03-16 DIAGNOSIS — R110 Nausea: Secondary | ICD-10-CM | POA: Diagnosis not present

## 2023-03-16 DIAGNOSIS — M7989 Other specified soft tissue disorders: Secondary | ICD-10-CM | POA: Diagnosis not present

## 2023-03-16 DIAGNOSIS — I5189 Other ill-defined heart diseases: Secondary | ICD-10-CM | POA: Diagnosis not present

## 2023-03-16 DIAGNOSIS — Z9889 Other specified postprocedural states: Secondary | ICD-10-CM | POA: Diagnosis not present

## 2023-03-16 DIAGNOSIS — R1011 Right upper quadrant pain: Secondary | ICD-10-CM | POA: Diagnosis not present

## 2023-03-16 DIAGNOSIS — S92001A Unspecified fracture of right calcaneus, initial encounter for closed fracture: Secondary | ICD-10-CM | POA: Diagnosis not present

## 2023-03-16 DIAGNOSIS — D689 Coagulation defect, unspecified: Secondary | ICD-10-CM | POA: Diagnosis not present

## 2023-03-16 DIAGNOSIS — R059 Cough, unspecified: Secondary | ICD-10-CM | POA: Diagnosis not present

## 2023-03-16 DIAGNOSIS — Y838 Other surgical procedures as the cause of abnormal reaction of the patient, or of later complication, without mention of misadventure at the time of the procedure: Secondary | ICD-10-CM | POA: Diagnosis not present

## 2023-03-16 DIAGNOSIS — R4182 Altered mental status, unspecified: Secondary | ICD-10-CM | POA: Diagnosis not present

## 2023-03-16 DIAGNOSIS — E872 Acidosis, unspecified: Secondary | ICD-10-CM | POA: Diagnosis not present

## 2023-03-16 DIAGNOSIS — Z515 Encounter for palliative care: Secondary | ICD-10-CM | POA: Diagnosis not present

## 2023-03-16 DIAGNOSIS — S36531A Laceration of transverse colon, initial encounter: Secondary | ICD-10-CM | POA: Diagnosis not present

## 2023-03-16 DIAGNOSIS — D62 Acute posthemorrhagic anemia: Secondary | ICD-10-CM | POA: Diagnosis not present

## 2023-03-16 DIAGNOSIS — F05 Delirium due to known physiological condition: Secondary | ICD-10-CM | POA: Diagnosis not present

## 2023-03-16 DIAGNOSIS — D72829 Elevated white blood cell count, unspecified: Secondary | ICD-10-CM | POA: Diagnosis not present

## 2023-03-16 DIAGNOSIS — N179 Acute kidney failure, unspecified: Secondary | ICD-10-CM | POA: Diagnosis not present

## 2023-03-16 DIAGNOSIS — R1032 Left lower quadrant pain: Secondary | ICD-10-CM | POA: Diagnosis not present

## 2023-03-16 DIAGNOSIS — S79912A Unspecified injury of left hip, initial encounter: Secondary | ICD-10-CM | POA: Diagnosis not present

## 2023-03-16 DIAGNOSIS — Z933 Colostomy status: Secondary | ICD-10-CM | POA: Diagnosis not present

## 2023-03-16 DIAGNOSIS — S299XXA Unspecified injury of thorax, initial encounter: Secondary | ICD-10-CM | POA: Diagnosis not present

## 2023-03-16 DIAGNOSIS — R918 Other nonspecific abnormal finding of lung field: Secondary | ICD-10-CM | POA: Diagnosis not present

## 2023-03-16 DIAGNOSIS — E440 Moderate protein-calorie malnutrition: Secondary | ICD-10-CM | POA: Diagnosis not present

## 2023-03-16 DIAGNOSIS — S4992XA Unspecified injury of left shoulder and upper arm, initial encounter: Secondary | ICD-10-CM | POA: Diagnosis not present

## 2023-03-16 DIAGNOSIS — S36538A Laceration of other part of colon, initial encounter: Secondary | ICD-10-CM | POA: Diagnosis not present

## 2023-03-16 DIAGNOSIS — Z86718 Personal history of other venous thrombosis and embolism: Secondary | ICD-10-CM | POA: Diagnosis not present

## 2023-03-16 DIAGNOSIS — I4891 Unspecified atrial fibrillation: Secondary | ICD-10-CM | POA: Diagnosis not present

## 2023-03-16 DIAGNOSIS — Z79899 Other long term (current) drug therapy: Secondary | ICD-10-CM | POA: Diagnosis not present

## 2023-03-16 DIAGNOSIS — G9341 Metabolic encephalopathy: Secondary | ICD-10-CM | POA: Diagnosis not present

## 2023-03-16 DIAGNOSIS — R188 Other ascites: Secondary | ICD-10-CM | POA: Diagnosis not present

## 2023-03-16 DIAGNOSIS — M1712 Unilateral primary osteoarthritis, left knee: Secondary | ICD-10-CM | POA: Diagnosis not present

## 2023-03-16 DIAGNOSIS — S27321A Contusion of lung, unilateral, initial encounter: Secondary | ICD-10-CM | POA: Diagnosis not present

## 2023-03-16 DIAGNOSIS — S37032A Laceration of left kidney, unspecified degree, initial encounter: Secondary | ICD-10-CM | POA: Diagnosis not present

## 2023-03-16 DIAGNOSIS — K659 Peritonitis, unspecified: Secondary | ICD-10-CM | POA: Diagnosis not present

## 2023-03-16 DIAGNOSIS — Z452 Encounter for adjustment and management of vascular access device: Secondary | ICD-10-CM | POA: Diagnosis not present

## 2023-03-16 DIAGNOSIS — R0902 Hypoxemia: Secondary | ICD-10-CM | POA: Diagnosis not present

## 2023-03-16 DIAGNOSIS — S53105A Unspecified dislocation of left ulnohumeral joint, initial encounter: Secondary | ICD-10-CM | POA: Diagnosis not present

## 2023-03-16 DIAGNOSIS — S31109A Unspecified open wound of abdominal wall, unspecified quadrant without penetration into peritoneal cavity, initial encounter: Secondary | ICD-10-CM | POA: Diagnosis not present

## 2023-03-16 DIAGNOSIS — S199XXA Unspecified injury of neck, initial encounter: Secondary | ICD-10-CM | POA: Diagnosis not present

## 2023-03-16 DIAGNOSIS — S22089A Unspecified fracture of T11-T12 vertebra, initial encounter for closed fracture: Secondary | ICD-10-CM | POA: Diagnosis not present

## 2023-03-16 DIAGNOSIS — B379 Candidiasis, unspecified: Secondary | ICD-10-CM | POA: Diagnosis not present

## 2023-03-16 DIAGNOSIS — Z9911 Dependence on respirator [ventilator] status: Secondary | ICD-10-CM | POA: Diagnosis not present

## 2023-03-16 DIAGNOSIS — K55059 Acute (reversible) ischemia of intestine, part and extent unspecified: Secondary | ICD-10-CM | POA: Diagnosis not present

## 2023-03-16 DIAGNOSIS — R9431 Abnormal electrocardiogram [ECG] [EKG]: Secondary | ICD-10-CM | POA: Diagnosis not present

## 2023-03-16 DIAGNOSIS — I10 Essential (primary) hypertension: Secondary | ICD-10-CM | POA: Diagnosis not present

## 2023-03-16 DIAGNOSIS — S37052A Moderate laceration of left kidney, initial encounter: Secondary | ICD-10-CM | POA: Diagnosis not present

## 2023-03-16 DIAGNOSIS — R140 Abdominal distension (gaseous): Secondary | ICD-10-CM | POA: Diagnosis not present

## 2023-03-16 DIAGNOSIS — S36598A Other injury of other part of colon, initial encounter: Secondary | ICD-10-CM | POA: Diagnosis not present

## 2023-03-16 DIAGNOSIS — R109 Unspecified abdominal pain: Secondary | ICD-10-CM | POA: Diagnosis not present

## 2023-03-16 DIAGNOSIS — S52002D Unspecified fracture of upper end of left ulna, subsequent encounter for closed fracture with routine healing: Secondary | ICD-10-CM | POA: Diagnosis not present

## 2023-03-16 DIAGNOSIS — F32A Depression, unspecified: Secondary | ICD-10-CM | POA: Diagnosis not present

## 2023-03-16 DIAGNOSIS — J811 Chronic pulmonary edema: Secondary | ICD-10-CM | POA: Diagnosis not present

## 2023-03-16 DIAGNOSIS — S3991XA Unspecified injury of abdomen, initial encounter: Secondary | ICD-10-CM | POA: Diagnosis not present

## 2023-03-16 DIAGNOSIS — E039 Hypothyroidism, unspecified: Secondary | ICD-10-CM | POA: Diagnosis not present

## 2023-03-16 DIAGNOSIS — R630 Anorexia: Secondary | ICD-10-CM | POA: Diagnosis not present

## 2023-03-16 DIAGNOSIS — E878 Other disorders of electrolyte and fluid balance, not elsewhere classified: Secondary | ICD-10-CM | POA: Diagnosis not present

## 2023-03-16 DIAGNOSIS — B965 Pseudomonas (aeruginosa) (mallei) (pseudomallei) as the cause of diseases classified elsewhere: Secondary | ICD-10-CM | POA: Diagnosis not present

## 2023-03-16 DIAGNOSIS — S8002XA Contusion of left knee, initial encounter: Secondary | ICD-10-CM | POA: Diagnosis not present

## 2023-03-16 DIAGNOSIS — R93421 Abnormal radiologic findings on diagnostic imaging of right kidney: Secondary | ICD-10-CM | POA: Diagnosis not present

## 2023-03-16 DIAGNOSIS — K458 Other specified abdominal hernia without obstruction or gangrene: Secondary | ICD-10-CM | POA: Diagnosis not present

## 2023-03-16 DIAGNOSIS — K551 Chronic vascular disorders of intestine: Secondary | ICD-10-CM | POA: Diagnosis not present

## 2023-03-16 DIAGNOSIS — S30811A Abrasion of abdominal wall, initial encounter: Secondary | ICD-10-CM | POA: Diagnosis not present

## 2023-03-16 DIAGNOSIS — I82A11 Acute embolism and thrombosis of right axillary vein: Secondary | ICD-10-CM | POA: Diagnosis not present

## 2023-03-16 DIAGNOSIS — S92061A Displaced intraarticular fracture of right calcaneus, initial encounter for closed fracture: Secondary | ICD-10-CM | POA: Diagnosis not present

## 2023-03-16 DIAGNOSIS — E43 Unspecified severe protein-calorie malnutrition: Secondary | ICD-10-CM | POA: Diagnosis not present

## 2023-03-16 DIAGNOSIS — S52022A Displaced fracture of olecranon process without intraarticular extension of left ulna, initial encounter for closed fracture: Secondary | ICD-10-CM | POA: Diagnosis not present

## 2023-03-16 DIAGNOSIS — T8130XA Disruption of wound, unspecified, initial encounter: Secondary | ICD-10-CM | POA: Diagnosis not present

## 2023-03-16 DIAGNOSIS — K55069 Acute infarction of intestine, part and extent unspecified: Secondary | ICD-10-CM | POA: Diagnosis not present

## 2023-03-16 DIAGNOSIS — E46 Unspecified protein-calorie malnutrition: Secondary | ICD-10-CM | POA: Diagnosis not present

## 2023-03-16 DIAGNOSIS — J984 Other disorders of lung: Secondary | ICD-10-CM | POA: Diagnosis not present

## 2023-03-16 DIAGNOSIS — S2220XA Unspecified fracture of sternum, initial encounter for closed fracture: Secondary | ICD-10-CM | POA: Diagnosis not present

## 2023-03-16 DIAGNOSIS — R000 Tachycardia, unspecified: Secondary | ICD-10-CM | POA: Diagnosis not present

## 2023-03-16 DIAGNOSIS — K439 Ventral hernia without obstruction or gangrene: Secondary | ICD-10-CM | POA: Diagnosis not present

## 2023-03-16 DIAGNOSIS — R933 Abnormal findings on diagnostic imaging of other parts of digestive tract: Secondary | ICD-10-CM | POA: Diagnosis not present

## 2023-03-16 DIAGNOSIS — K658 Other peritonitis: Secondary | ICD-10-CM | POA: Diagnosis not present

## 2023-03-16 DIAGNOSIS — E785 Hyperlipidemia, unspecified: Secondary | ICD-10-CM | POA: Diagnosis not present

## 2023-03-16 DIAGNOSIS — R638 Other symptoms and signs concerning food and fluid intake: Secondary | ICD-10-CM | POA: Diagnosis not present

## 2023-03-16 DIAGNOSIS — I3481 Nonrheumatic mitral (valve) annulus calcification: Secondary | ICD-10-CM | POA: Diagnosis not present

## 2023-03-16 DIAGNOSIS — I493 Ventricular premature depolarization: Secondary | ICD-10-CM | POA: Diagnosis not present

## 2023-03-16 DIAGNOSIS — J9811 Atelectasis: Secondary | ICD-10-CM | POA: Diagnosis not present

## 2023-03-16 DIAGNOSIS — T07XXXA Unspecified multiple injuries, initial encounter: Secondary | ICD-10-CM | POA: Diagnosis not present

## 2023-03-16 DIAGNOSIS — M25522 Pain in left elbow: Secondary | ICD-10-CM | POA: Diagnosis not present

## 2023-03-17 DIAGNOSIS — S92061A Displaced intraarticular fracture of right calcaneus, initial encounter for closed fracture: Secondary | ICD-10-CM | POA: Diagnosis not present

## 2023-03-17 DIAGNOSIS — S53105A Unspecified dislocation of left ulnohumeral joint, initial encounter: Secondary | ICD-10-CM | POA: Diagnosis not present

## 2023-03-17 DIAGNOSIS — J9600 Acute respiratory failure, unspecified whether with hypoxia or hypercapnia: Secondary | ICD-10-CM | POA: Diagnosis not present

## 2023-03-17 DIAGNOSIS — S52022A Displaced fracture of olecranon process without intraarticular extension of left ulna, initial encounter for closed fracture: Secondary | ICD-10-CM | POA: Diagnosis not present

## 2023-03-17 DIAGNOSIS — S92001A Unspecified fracture of right calcaneus, initial encounter for closed fracture: Secondary | ICD-10-CM | POA: Diagnosis not present

## 2023-03-17 DIAGNOSIS — I5189 Other ill-defined heart diseases: Secondary | ICD-10-CM | POA: Diagnosis not present

## 2023-03-17 DIAGNOSIS — R4182 Altered mental status, unspecified: Secondary | ICD-10-CM | POA: Diagnosis not present

## 2023-03-17 DIAGNOSIS — N179 Acute kidney failure, unspecified: Secondary | ICD-10-CM | POA: Diagnosis not present

## 2023-03-17 DIAGNOSIS — Z4682 Encounter for fitting and adjustment of non-vascular catheter: Secondary | ICD-10-CM | POA: Diagnosis not present

## 2023-03-17 DIAGNOSIS — K9172 Accidental puncture and laceration of a digestive system organ or structure during other procedure: Secondary | ICD-10-CM | POA: Diagnosis not present

## 2023-03-17 DIAGNOSIS — E872 Acidosis, unspecified: Secondary | ICD-10-CM | POA: Diagnosis not present

## 2023-03-17 DIAGNOSIS — Z9911 Dependence on respirator [ventilator] status: Secondary | ICD-10-CM | POA: Diagnosis not present

## 2023-03-17 DIAGNOSIS — J9811 Atelectasis: Secondary | ICD-10-CM | POA: Diagnosis not present

## 2023-03-17 DIAGNOSIS — S36598A Other injury of other part of colon, initial encounter: Secondary | ICD-10-CM | POA: Diagnosis not present

## 2023-03-17 DIAGNOSIS — I3481 Nonrheumatic mitral (valve) annulus calcification: Secondary | ICD-10-CM | POA: Diagnosis not present

## 2023-03-17 DIAGNOSIS — R0902 Hypoxemia: Secondary | ICD-10-CM | POA: Diagnosis not present

## 2023-03-18 DIAGNOSIS — N179 Acute kidney failure, unspecified: Secondary | ICD-10-CM | POA: Diagnosis not present

## 2023-03-18 DIAGNOSIS — J9600 Acute respiratory failure, unspecified whether with hypoxia or hypercapnia: Secondary | ICD-10-CM | POA: Diagnosis not present

## 2023-03-18 DIAGNOSIS — R4182 Altered mental status, unspecified: Secondary | ICD-10-CM | POA: Diagnosis not present

## 2023-03-18 DIAGNOSIS — E872 Acidosis, unspecified: Secondary | ICD-10-CM | POA: Diagnosis not present

## 2023-03-18 DIAGNOSIS — Z9911 Dependence on respirator [ventilator] status: Secondary | ICD-10-CM | POA: Diagnosis not present

## 2023-03-19 DIAGNOSIS — D689 Coagulation defect, unspecified: Secondary | ICD-10-CM | POA: Diagnosis not present

## 2023-03-19 DIAGNOSIS — R9431 Abnormal electrocardiogram [ECG] [EKG]: Secondary | ICD-10-CM | POA: Diagnosis not present

## 2023-03-19 DIAGNOSIS — N179 Acute kidney failure, unspecified: Secondary | ICD-10-CM | POA: Diagnosis not present

## 2023-03-19 DIAGNOSIS — Z9911 Dependence on respirator [ventilator] status: Secondary | ICD-10-CM | POA: Diagnosis not present

## 2023-03-19 DIAGNOSIS — S36598A Other injury of other part of colon, initial encounter: Secondary | ICD-10-CM | POA: Diagnosis not present

## 2023-03-19 DIAGNOSIS — Z452 Encounter for adjustment and management of vascular access device: Secondary | ICD-10-CM | POA: Diagnosis not present

## 2023-03-19 DIAGNOSIS — Z4682 Encounter for fitting and adjustment of non-vascular catheter: Secondary | ICD-10-CM | POA: Diagnosis not present

## 2023-03-19 DIAGNOSIS — K558 Other vascular disorders of intestine: Secondary | ICD-10-CM | POA: Diagnosis not present

## 2023-03-19 DIAGNOSIS — S31109A Unspecified open wound of abdominal wall, unspecified quadrant without penetration into peritoneal cavity, initial encounter: Secondary | ICD-10-CM | POA: Diagnosis not present

## 2023-03-19 DIAGNOSIS — R4182 Altered mental status, unspecified: Secondary | ICD-10-CM | POA: Diagnosis not present

## 2023-03-19 DIAGNOSIS — J9600 Acute respiratory failure, unspecified whether with hypoxia or hypercapnia: Secondary | ICD-10-CM | POA: Diagnosis not present

## 2023-03-19 DIAGNOSIS — K6389 Other specified diseases of intestine: Secondary | ICD-10-CM | POA: Diagnosis not present

## 2023-03-20 DIAGNOSIS — D62 Acute posthemorrhagic anemia: Secondary | ICD-10-CM | POA: Diagnosis not present

## 2023-03-20 DIAGNOSIS — Z4682 Encounter for fitting and adjustment of non-vascular catheter: Secondary | ICD-10-CM | POA: Diagnosis not present

## 2023-03-20 DIAGNOSIS — R918 Other nonspecific abnormal finding of lung field: Secondary | ICD-10-CM | POA: Diagnosis not present

## 2023-03-20 DIAGNOSIS — G9341 Metabolic encephalopathy: Secondary | ICD-10-CM | POA: Diagnosis not present

## 2023-03-20 DIAGNOSIS — Z9911 Dependence on respirator [ventilator] status: Secondary | ICD-10-CM | POA: Diagnosis not present

## 2023-03-20 DIAGNOSIS — Z452 Encounter for adjustment and management of vascular access device: Secondary | ICD-10-CM | POA: Diagnosis not present

## 2023-03-20 DIAGNOSIS — S299XXA Unspecified injury of thorax, initial encounter: Secondary | ICD-10-CM | POA: Diagnosis not present

## 2023-03-20 DIAGNOSIS — E878 Other disorders of electrolyte and fluid balance, not elsewhere classified: Secondary | ICD-10-CM | POA: Diagnosis not present

## 2023-03-20 DIAGNOSIS — R9431 Abnormal electrocardiogram [ECG] [EKG]: Secondary | ICD-10-CM | POA: Diagnosis not present

## 2023-03-21 DIAGNOSIS — S92001A Unspecified fracture of right calcaneus, initial encounter for closed fracture: Secondary | ICD-10-CM | POA: Diagnosis not present

## 2023-03-21 DIAGNOSIS — J9811 Atelectasis: Secondary | ICD-10-CM | POA: Diagnosis not present

## 2023-03-21 DIAGNOSIS — Z9889 Other specified postprocedural states: Secondary | ICD-10-CM | POA: Diagnosis not present

## 2023-03-21 DIAGNOSIS — Z9911 Dependence on respirator [ventilator] status: Secondary | ICD-10-CM | POA: Diagnosis not present

## 2023-03-21 DIAGNOSIS — S52022A Displaced fracture of olecranon process without intraarticular extension of left ulna, initial encounter for closed fracture: Secondary | ICD-10-CM | POA: Diagnosis not present

## 2023-03-21 DIAGNOSIS — E878 Other disorders of electrolyte and fluid balance, not elsewhere classified: Secondary | ICD-10-CM | POA: Diagnosis not present

## 2023-03-21 DIAGNOSIS — G9341 Metabolic encephalopathy: Secondary | ICD-10-CM | POA: Diagnosis not present

## 2023-03-21 DIAGNOSIS — S299XXA Unspecified injury of thorax, initial encounter: Secondary | ICD-10-CM | POA: Diagnosis not present

## 2023-03-21 DIAGNOSIS — D62 Acute posthemorrhagic anemia: Secondary | ICD-10-CM | POA: Diagnosis not present

## 2023-03-21 DIAGNOSIS — J90 Pleural effusion, not elsewhere classified: Secondary | ICD-10-CM | POA: Diagnosis not present

## 2023-03-22 DIAGNOSIS — D62 Acute posthemorrhagic anemia: Secondary | ICD-10-CM | POA: Diagnosis not present

## 2023-03-22 DIAGNOSIS — I4891 Unspecified atrial fibrillation: Secondary | ICD-10-CM | POA: Diagnosis not present

## 2023-03-22 DIAGNOSIS — S299XXA Unspecified injury of thorax, initial encounter: Secondary | ICD-10-CM | POA: Diagnosis not present

## 2023-03-22 DIAGNOSIS — G9341 Metabolic encephalopathy: Secondary | ICD-10-CM | POA: Diagnosis not present

## 2023-03-23 DIAGNOSIS — G9341 Metabolic encephalopathy: Secondary | ICD-10-CM | POA: Diagnosis not present

## 2023-03-23 DIAGNOSIS — R0600 Dyspnea, unspecified: Secondary | ICD-10-CM | POA: Diagnosis not present

## 2023-03-23 DIAGNOSIS — I4891 Unspecified atrial fibrillation: Secondary | ICD-10-CM | POA: Diagnosis not present

## 2023-03-23 DIAGNOSIS — M7989 Other specified soft tissue disorders: Secondary | ICD-10-CM | POA: Diagnosis not present

## 2023-03-23 DIAGNOSIS — I82A11 Acute embolism and thrombosis of right axillary vein: Secondary | ICD-10-CM | POA: Diagnosis not present

## 2023-03-23 DIAGNOSIS — S299XXA Unspecified injury of thorax, initial encounter: Secondary | ICD-10-CM | POA: Diagnosis not present

## 2023-03-23 DIAGNOSIS — J90 Pleural effusion, not elsewhere classified: Secondary | ICD-10-CM | POA: Diagnosis not present

## 2023-03-23 DIAGNOSIS — R918 Other nonspecific abnormal finding of lung field: Secondary | ICD-10-CM | POA: Diagnosis not present

## 2023-03-23 DIAGNOSIS — I10 Essential (primary) hypertension: Secondary | ICD-10-CM | POA: Diagnosis not present

## 2023-03-24 DIAGNOSIS — S51811A Laceration without foreign body of right forearm, initial encounter: Secondary | ICD-10-CM | POA: Diagnosis not present

## 2023-03-24 DIAGNOSIS — S299XXA Unspecified injury of thorax, initial encounter: Secondary | ICD-10-CM | POA: Diagnosis not present

## 2023-03-24 DIAGNOSIS — J9811 Atelectasis: Secondary | ICD-10-CM | POA: Diagnosis not present

## 2023-03-24 DIAGNOSIS — R918 Other nonspecific abnormal finding of lung field: Secondary | ICD-10-CM | POA: Diagnosis not present

## 2023-03-24 DIAGNOSIS — J90 Pleural effusion, not elsewhere classified: Secondary | ICD-10-CM | POA: Diagnosis not present

## 2023-03-24 DIAGNOSIS — R188 Other ascites: Secondary | ICD-10-CM | POA: Diagnosis not present

## 2023-03-24 DIAGNOSIS — G9341 Metabolic encephalopathy: Secondary | ICD-10-CM | POA: Diagnosis not present

## 2023-03-24 DIAGNOSIS — I4891 Unspecified atrial fibrillation: Secondary | ICD-10-CM | POA: Diagnosis not present

## 2023-03-24 DIAGNOSIS — I10 Essential (primary) hypertension: Secondary | ICD-10-CM | POA: Diagnosis not present

## 2023-03-25 DIAGNOSIS — S37052A Moderate laceration of left kidney, initial encounter: Secondary | ICD-10-CM | POA: Diagnosis not present

## 2023-03-25 DIAGNOSIS — J90 Pleural effusion, not elsewhere classified: Secondary | ICD-10-CM | POA: Diagnosis not present

## 2023-03-25 DIAGNOSIS — S2243XA Multiple fractures of ribs, bilateral, initial encounter for closed fracture: Secondary | ICD-10-CM | POA: Diagnosis not present

## 2023-03-25 DIAGNOSIS — R059 Cough, unspecified: Secondary | ICD-10-CM | POA: Diagnosis not present

## 2023-03-25 DIAGNOSIS — S2220XA Unspecified fracture of sternum, initial encounter for closed fracture: Secondary | ICD-10-CM | POA: Diagnosis not present

## 2023-03-26 DIAGNOSIS — R9431 Abnormal electrocardiogram [ECG] [EKG]: Secondary | ICD-10-CM | POA: Diagnosis not present

## 2023-03-27 DIAGNOSIS — S2220XA Unspecified fracture of sternum, initial encounter for closed fracture: Secondary | ICD-10-CM | POA: Diagnosis not present

## 2023-03-27 DIAGNOSIS — S2243XA Multiple fractures of ribs, bilateral, initial encounter for closed fracture: Secondary | ICD-10-CM | POA: Diagnosis not present

## 2023-03-27 DIAGNOSIS — S37052A Moderate laceration of left kidney, initial encounter: Secondary | ICD-10-CM | POA: Diagnosis not present

## 2023-03-27 DIAGNOSIS — I10 Essential (primary) hypertension: Secondary | ICD-10-CM | POA: Diagnosis not present

## 2023-03-28 DIAGNOSIS — J984 Other disorders of lung: Secondary | ICD-10-CM | POA: Diagnosis not present

## 2023-03-28 DIAGNOSIS — D72829 Elevated white blood cell count, unspecified: Secondary | ICD-10-CM | POA: Diagnosis not present

## 2023-03-28 DIAGNOSIS — S2243XA Multiple fractures of ribs, bilateral, initial encounter for closed fracture: Secondary | ICD-10-CM | POA: Diagnosis not present

## 2023-03-28 DIAGNOSIS — S2220XA Unspecified fracture of sternum, initial encounter for closed fracture: Secondary | ICD-10-CM | POA: Diagnosis not present

## 2023-03-28 DIAGNOSIS — J90 Pleural effusion, not elsewhere classified: Secondary | ICD-10-CM | POA: Diagnosis not present

## 2023-03-28 DIAGNOSIS — R109 Unspecified abdominal pain: Secondary | ICD-10-CM | POA: Diagnosis not present

## 2023-03-28 DIAGNOSIS — S37052A Moderate laceration of left kidney, initial encounter: Secondary | ICD-10-CM | POA: Diagnosis not present

## 2023-03-29 DIAGNOSIS — S2220XA Unspecified fracture of sternum, initial encounter for closed fracture: Secondary | ICD-10-CM | POA: Diagnosis not present

## 2023-03-29 DIAGNOSIS — S37052A Moderate laceration of left kidney, initial encounter: Secondary | ICD-10-CM | POA: Diagnosis not present

## 2023-03-29 DIAGNOSIS — R059 Cough, unspecified: Secondary | ICD-10-CM | POA: Diagnosis not present

## 2023-03-29 DIAGNOSIS — E039 Hypothyroidism, unspecified: Secondary | ICD-10-CM | POA: Diagnosis not present

## 2023-03-29 DIAGNOSIS — R1312 Dysphagia, oropharyngeal phase: Secondary | ICD-10-CM | POA: Diagnosis not present

## 2023-03-29 DIAGNOSIS — S2243XA Multiple fractures of ribs, bilateral, initial encounter for closed fracture: Secondary | ICD-10-CM | POA: Diagnosis not present

## 2023-03-30 DIAGNOSIS — S2220XA Unspecified fracture of sternum, initial encounter for closed fracture: Secondary | ICD-10-CM | POA: Diagnosis not present

## 2023-03-30 DIAGNOSIS — M1712 Unilateral primary osteoarthritis, left knee: Secondary | ICD-10-CM | POA: Diagnosis not present

## 2023-03-30 DIAGNOSIS — J90 Pleural effusion, not elsewhere classified: Secondary | ICD-10-CM | POA: Diagnosis not present

## 2023-03-30 DIAGNOSIS — S37052A Moderate laceration of left kidney, initial encounter: Secondary | ICD-10-CM | POA: Diagnosis not present

## 2023-03-30 DIAGNOSIS — S2243XA Multiple fractures of ribs, bilateral, initial encounter for closed fracture: Secondary | ICD-10-CM | POA: Diagnosis not present

## 2023-03-30 DIAGNOSIS — S8002XA Contusion of left knee, initial encounter: Secondary | ICD-10-CM | POA: Diagnosis not present

## 2023-03-31 DIAGNOSIS — R0902 Hypoxemia: Secondary | ICD-10-CM | POA: Diagnosis not present

## 2023-03-31 DIAGNOSIS — J9811 Atelectasis: Secondary | ICD-10-CM | POA: Diagnosis not present

## 2023-03-31 DIAGNOSIS — S2243XA Multiple fractures of ribs, bilateral, initial encounter for closed fracture: Secondary | ICD-10-CM | POA: Diagnosis not present

## 2023-03-31 DIAGNOSIS — R918 Other nonspecific abnormal finding of lung field: Secondary | ICD-10-CM | POA: Diagnosis not present

## 2023-03-31 DIAGNOSIS — S37052A Moderate laceration of left kidney, initial encounter: Secondary | ICD-10-CM | POA: Diagnosis not present

## 2023-03-31 DIAGNOSIS — J90 Pleural effusion, not elsewhere classified: Secondary | ICD-10-CM | POA: Diagnosis not present

## 2023-03-31 DIAGNOSIS — S92001A Unspecified fracture of right calcaneus, initial encounter for closed fracture: Secondary | ICD-10-CM | POA: Diagnosis not present

## 2023-04-01 DIAGNOSIS — R0902 Hypoxemia: Secondary | ICD-10-CM | POA: Diagnosis not present

## 2023-04-01 DIAGNOSIS — J811 Chronic pulmonary edema: Secondary | ICD-10-CM | POA: Diagnosis not present

## 2023-04-01 DIAGNOSIS — J90 Pleural effusion, not elsewhere classified: Secondary | ICD-10-CM | POA: Diagnosis not present

## 2023-04-01 DIAGNOSIS — S2220XA Unspecified fracture of sternum, initial encounter for closed fracture: Secondary | ICD-10-CM | POA: Diagnosis not present

## 2023-04-01 DIAGNOSIS — S2243XA Multiple fractures of ribs, bilateral, initial encounter for closed fracture: Secondary | ICD-10-CM | POA: Diagnosis not present

## 2023-04-01 DIAGNOSIS — J9811 Atelectasis: Secondary | ICD-10-CM | POA: Diagnosis not present

## 2023-04-02 DIAGNOSIS — S2220XA Unspecified fracture of sternum, initial encounter for closed fracture: Secondary | ICD-10-CM | POA: Diagnosis not present

## 2023-04-02 DIAGNOSIS — S2243XA Multiple fractures of ribs, bilateral, initial encounter for closed fracture: Secondary | ICD-10-CM | POA: Diagnosis not present

## 2023-04-02 DIAGNOSIS — J9811 Atelectasis: Secondary | ICD-10-CM | POA: Diagnosis not present

## 2023-04-02 DIAGNOSIS — S22088A Other fracture of T11-T12 vertebra, initial encounter for closed fracture: Secondary | ICD-10-CM | POA: Diagnosis not present

## 2023-04-04 DIAGNOSIS — S2243XA Multiple fractures of ribs, bilateral, initial encounter for closed fracture: Secondary | ICD-10-CM | POA: Diagnosis not present

## 2023-04-04 DIAGNOSIS — J9811 Atelectasis: Secondary | ICD-10-CM | POA: Diagnosis not present

## 2023-04-04 DIAGNOSIS — J90 Pleural effusion, not elsewhere classified: Secondary | ICD-10-CM | POA: Diagnosis not present

## 2023-04-04 DIAGNOSIS — S2220XA Unspecified fracture of sternum, initial encounter for closed fracture: Secondary | ICD-10-CM | POA: Diagnosis not present

## 2023-04-05 DIAGNOSIS — J90 Pleural effusion, not elsewhere classified: Secondary | ICD-10-CM | POA: Diagnosis not present

## 2023-04-05 DIAGNOSIS — S2243XA Multiple fractures of ribs, bilateral, initial encounter for closed fracture: Secondary | ICD-10-CM | POA: Diagnosis not present

## 2023-04-05 DIAGNOSIS — J9811 Atelectasis: Secondary | ICD-10-CM | POA: Diagnosis not present

## 2023-04-05 DIAGNOSIS — S2220XA Unspecified fracture of sternum, initial encounter for closed fracture: Secondary | ICD-10-CM | POA: Diagnosis not present

## 2023-04-08 DIAGNOSIS — J9811 Atelectasis: Secondary | ICD-10-CM | POA: Diagnosis not present

## 2023-04-08 DIAGNOSIS — R4182 Altered mental status, unspecified: Secondary | ICD-10-CM | POA: Diagnosis not present

## 2023-04-08 DIAGNOSIS — J90 Pleural effusion, not elsewhere classified: Secondary | ICD-10-CM | POA: Diagnosis not present

## 2023-04-08 DIAGNOSIS — S27322A Contusion of lung, bilateral, initial encounter: Secondary | ICD-10-CM | POA: Diagnosis not present

## 2023-04-08 DIAGNOSIS — R933 Abnormal findings on diagnostic imaging of other parts of digestive tract: Secondary | ICD-10-CM | POA: Diagnosis not present

## 2023-04-08 DIAGNOSIS — Z933 Colostomy status: Secondary | ICD-10-CM | POA: Diagnosis not present

## 2023-04-09 DIAGNOSIS — J90 Pleural effusion, not elsewhere classified: Secondary | ICD-10-CM | POA: Diagnosis not present

## 2023-04-09 DIAGNOSIS — S2243XA Multiple fractures of ribs, bilateral, initial encounter for closed fracture: Secondary | ICD-10-CM | POA: Diagnosis not present

## 2023-04-09 DIAGNOSIS — Z933 Colostomy status: Secondary | ICD-10-CM | POA: Diagnosis not present

## 2023-04-09 DIAGNOSIS — J9811 Atelectasis: Secondary | ICD-10-CM | POA: Diagnosis not present

## 2023-04-09 DIAGNOSIS — S2220XA Unspecified fracture of sternum, initial encounter for closed fracture: Secondary | ICD-10-CM | POA: Diagnosis not present

## 2023-04-09 DIAGNOSIS — R140 Abdominal distension (gaseous): Secondary | ICD-10-CM | POA: Diagnosis not present

## 2023-04-10 DIAGNOSIS — J90 Pleural effusion, not elsewhere classified: Secondary | ICD-10-CM | POA: Diagnosis not present

## 2023-04-10 DIAGNOSIS — S2220XA Unspecified fracture of sternum, initial encounter for closed fracture: Secondary | ICD-10-CM | POA: Diagnosis not present

## 2023-04-10 DIAGNOSIS — S2243XA Multiple fractures of ribs, bilateral, initial encounter for closed fracture: Secondary | ICD-10-CM | POA: Diagnosis not present

## 2023-04-10 DIAGNOSIS — J9811 Atelectasis: Secondary | ICD-10-CM | POA: Diagnosis not present

## 2023-04-11 DIAGNOSIS — S2220XA Unspecified fracture of sternum, initial encounter for closed fracture: Secondary | ICD-10-CM | POA: Diagnosis not present

## 2023-04-11 DIAGNOSIS — J9811 Atelectasis: Secondary | ICD-10-CM | POA: Diagnosis not present

## 2023-04-11 DIAGNOSIS — J90 Pleural effusion, not elsewhere classified: Secondary | ICD-10-CM | POA: Diagnosis not present

## 2023-04-11 DIAGNOSIS — S2243XA Multiple fractures of ribs, bilateral, initial encounter for closed fracture: Secondary | ICD-10-CM | POA: Diagnosis not present

## 2023-04-12 DIAGNOSIS — Z4682 Encounter for fitting and adjustment of non-vascular catheter: Secondary | ICD-10-CM | POA: Diagnosis not present

## 2023-04-12 DIAGNOSIS — S2243XA Multiple fractures of ribs, bilateral, initial encounter for closed fracture: Secondary | ICD-10-CM | POA: Diagnosis not present

## 2023-04-12 DIAGNOSIS — S2220XA Unspecified fracture of sternum, initial encounter for closed fracture: Secondary | ICD-10-CM | POA: Diagnosis not present

## 2023-04-12 DIAGNOSIS — J90 Pleural effusion, not elsewhere classified: Secondary | ICD-10-CM | POA: Diagnosis not present

## 2023-04-12 DIAGNOSIS — Z452 Encounter for adjustment and management of vascular access device: Secondary | ICD-10-CM | POA: Diagnosis not present

## 2023-04-12 DIAGNOSIS — S27322A Contusion of lung, bilateral, initial encounter: Secondary | ICD-10-CM | POA: Diagnosis not present

## 2023-04-13 DIAGNOSIS — J9811 Atelectasis: Secondary | ICD-10-CM | POA: Diagnosis not present

## 2023-04-13 DIAGNOSIS — K55059 Acute (reversible) ischemia of intestine, part and extent unspecified: Secondary | ICD-10-CM | POA: Diagnosis not present

## 2023-04-13 DIAGNOSIS — Z515 Encounter for palliative care: Secondary | ICD-10-CM | POA: Diagnosis not present

## 2023-04-13 DIAGNOSIS — S2243XA Multiple fractures of ribs, bilateral, initial encounter for closed fracture: Secondary | ICD-10-CM | POA: Diagnosis not present

## 2023-04-13 DIAGNOSIS — R630 Anorexia: Secondary | ICD-10-CM | POA: Diagnosis not present

## 2023-04-13 DIAGNOSIS — M7989 Other specified soft tissue disorders: Secondary | ICD-10-CM | POA: Diagnosis not present

## 2023-04-13 DIAGNOSIS — Z452 Encounter for adjustment and management of vascular access device: Secondary | ICD-10-CM | POA: Diagnosis not present

## 2023-04-13 DIAGNOSIS — J90 Pleural effusion, not elsewhere classified: Secondary | ICD-10-CM | POA: Diagnosis not present

## 2023-04-13 DIAGNOSIS — R682 Dry mouth, unspecified: Secondary | ICD-10-CM | POA: Diagnosis not present

## 2023-04-13 DIAGNOSIS — S2220XA Unspecified fracture of sternum, initial encounter for closed fracture: Secondary | ICD-10-CM | POA: Diagnosis not present

## 2023-04-13 DIAGNOSIS — Z4682 Encounter for fitting and adjustment of non-vascular catheter: Secondary | ICD-10-CM | POA: Diagnosis not present

## 2023-04-14 DIAGNOSIS — J90 Pleural effusion, not elsewhere classified: Secondary | ICD-10-CM | POA: Diagnosis not present

## 2023-04-14 DIAGNOSIS — S2243XA Multiple fractures of ribs, bilateral, initial encounter for closed fracture: Secondary | ICD-10-CM | POA: Diagnosis not present

## 2023-04-14 DIAGNOSIS — S2220XA Unspecified fracture of sternum, initial encounter for closed fracture: Secondary | ICD-10-CM | POA: Diagnosis not present

## 2023-04-14 DIAGNOSIS — J9811 Atelectasis: Secondary | ICD-10-CM | POA: Diagnosis not present

## 2023-04-15 DIAGNOSIS — J90 Pleural effusion, not elsewhere classified: Secondary | ICD-10-CM | POA: Diagnosis not present

## 2023-04-15 DIAGNOSIS — S2220XA Unspecified fracture of sternum, initial encounter for closed fracture: Secondary | ICD-10-CM | POA: Diagnosis not present

## 2023-04-15 DIAGNOSIS — S2243XA Multiple fractures of ribs, bilateral, initial encounter for closed fracture: Secondary | ICD-10-CM | POA: Diagnosis not present

## 2023-04-15 DIAGNOSIS — J9811 Atelectasis: Secondary | ICD-10-CM | POA: Diagnosis not present

## 2023-04-16 DIAGNOSIS — S2220XA Unspecified fracture of sternum, initial encounter for closed fracture: Secondary | ICD-10-CM | POA: Diagnosis not present

## 2023-04-16 DIAGNOSIS — J90 Pleural effusion, not elsewhere classified: Secondary | ICD-10-CM | POA: Diagnosis not present

## 2023-04-16 DIAGNOSIS — S2243XA Multiple fractures of ribs, bilateral, initial encounter for closed fracture: Secondary | ICD-10-CM | POA: Diagnosis not present

## 2023-04-16 DIAGNOSIS — J9811 Atelectasis: Secondary | ICD-10-CM | POA: Diagnosis not present

## 2023-04-17 DIAGNOSIS — Z515 Encounter for palliative care: Secondary | ICD-10-CM | POA: Diagnosis not present

## 2023-04-17 DIAGNOSIS — J90 Pleural effusion, not elsewhere classified: Secondary | ICD-10-CM | POA: Diagnosis not present

## 2023-04-17 DIAGNOSIS — T07XXXA Unspecified multiple injuries, initial encounter: Secondary | ICD-10-CM | POA: Diagnosis not present

## 2023-04-17 DIAGNOSIS — S2243XA Multiple fractures of ribs, bilateral, initial encounter for closed fracture: Secondary | ICD-10-CM | POA: Diagnosis not present

## 2023-04-17 DIAGNOSIS — J9811 Atelectasis: Secondary | ICD-10-CM | POA: Diagnosis not present

## 2023-04-17 DIAGNOSIS — S2220XA Unspecified fracture of sternum, initial encounter for closed fracture: Secondary | ICD-10-CM | POA: Diagnosis not present

## 2023-04-17 DIAGNOSIS — R110 Nausea: Secondary | ICD-10-CM | POA: Diagnosis not present

## 2023-04-18 DIAGNOSIS — S2243XA Multiple fractures of ribs, bilateral, initial encounter for closed fracture: Secondary | ICD-10-CM | POA: Diagnosis not present

## 2023-04-18 DIAGNOSIS — J90 Pleural effusion, not elsewhere classified: Secondary | ICD-10-CM | POA: Diagnosis not present

## 2023-04-18 DIAGNOSIS — R188 Other ascites: Secondary | ICD-10-CM | POA: Diagnosis not present

## 2023-04-18 DIAGNOSIS — S2220XA Unspecified fracture of sternum, initial encounter for closed fracture: Secondary | ICD-10-CM | POA: Diagnosis not present

## 2023-04-18 DIAGNOSIS — J9811 Atelectasis: Secondary | ICD-10-CM | POA: Diagnosis not present

## 2023-04-18 DIAGNOSIS — I493 Ventricular premature depolarization: Secondary | ICD-10-CM | POA: Diagnosis not present

## 2023-04-18 DIAGNOSIS — J984 Other disorders of lung: Secondary | ICD-10-CM | POA: Diagnosis not present

## 2023-04-19 DIAGNOSIS — R140 Abdominal distension (gaseous): Secondary | ICD-10-CM | POA: Diagnosis not present

## 2023-04-19 DIAGNOSIS — S92061A Displaced intraarticular fracture of right calcaneus, initial encounter for closed fracture: Secondary | ICD-10-CM | POA: Diagnosis not present

## 2023-04-19 DIAGNOSIS — R918 Other nonspecific abnormal finding of lung field: Secondary | ICD-10-CM | POA: Diagnosis not present

## 2023-04-19 DIAGNOSIS — Z9889 Other specified postprocedural states: Secondary | ICD-10-CM | POA: Diagnosis not present

## 2023-04-19 DIAGNOSIS — S2243XA Multiple fractures of ribs, bilateral, initial encounter for closed fracture: Secondary | ICD-10-CM | POA: Diagnosis not present

## 2023-04-19 DIAGNOSIS — Z4682 Encounter for fitting and adjustment of non-vascular catheter: Secondary | ICD-10-CM | POA: Diagnosis not present

## 2023-04-19 DIAGNOSIS — S2220XA Unspecified fracture of sternum, initial encounter for closed fracture: Secondary | ICD-10-CM | POA: Diagnosis not present

## 2023-04-19 DIAGNOSIS — Z515 Encounter for palliative care: Secondary | ICD-10-CM | POA: Diagnosis not present

## 2023-04-19 DIAGNOSIS — E43 Unspecified severe protein-calorie malnutrition: Secondary | ICD-10-CM | POA: Diagnosis not present

## 2023-04-19 DIAGNOSIS — Z452 Encounter for adjustment and management of vascular access device: Secondary | ICD-10-CM | POA: Diagnosis not present

## 2023-04-19 DIAGNOSIS — S52002D Unspecified fracture of upper end of left ulna, subsequent encounter for closed fracture with routine healing: Secondary | ICD-10-CM | POA: Diagnosis not present

## 2023-04-19 DIAGNOSIS — J9811 Atelectasis: Secondary | ICD-10-CM | POA: Diagnosis not present

## 2023-04-19 DIAGNOSIS — R110 Nausea: Secondary | ICD-10-CM | POA: Diagnosis not present

## 2023-04-19 DIAGNOSIS — J90 Pleural effusion, not elsewhere classified: Secondary | ICD-10-CM | POA: Diagnosis not present

## 2023-04-20 DIAGNOSIS — F419 Anxiety disorder, unspecified: Secondary | ICD-10-CM | POA: Diagnosis not present

## 2023-04-20 DIAGNOSIS — K551 Chronic vascular disorders of intestine: Secondary | ICD-10-CM | POA: Diagnosis not present

## 2023-04-20 DIAGNOSIS — E785 Hyperlipidemia, unspecified: Secondary | ICD-10-CM | POA: Diagnosis not present

## 2023-04-20 DIAGNOSIS — Z79899 Other long term (current) drug therapy: Secondary | ICD-10-CM | POA: Diagnosis not present

## 2023-04-20 DIAGNOSIS — J9811 Atelectasis: Secondary | ICD-10-CM | POA: Diagnosis not present

## 2023-04-20 DIAGNOSIS — J90 Pleural effusion, not elsewhere classified: Secondary | ICD-10-CM | POA: Diagnosis not present

## 2023-04-20 DIAGNOSIS — R638 Other symptoms and signs concerning food and fluid intake: Secondary | ICD-10-CM | POA: Diagnosis not present

## 2023-04-20 DIAGNOSIS — N390 Urinary tract infection, site not specified: Secondary | ICD-10-CM | POA: Diagnosis not present

## 2023-04-20 DIAGNOSIS — S2243XA Multiple fractures of ribs, bilateral, initial encounter for closed fracture: Secondary | ICD-10-CM | POA: Diagnosis not present

## 2023-04-20 DIAGNOSIS — R110 Nausea: Secondary | ICD-10-CM | POA: Diagnosis not present

## 2023-04-20 DIAGNOSIS — S2220XA Unspecified fracture of sternum, initial encounter for closed fracture: Secondary | ICD-10-CM | POA: Diagnosis not present

## 2023-04-20 DIAGNOSIS — F32A Depression, unspecified: Secondary | ICD-10-CM | POA: Diagnosis not present

## 2023-04-20 DIAGNOSIS — I10 Essential (primary) hypertension: Secondary | ICD-10-CM | POA: Diagnosis not present

## 2023-04-20 DIAGNOSIS — E039 Hypothyroidism, unspecified: Secondary | ICD-10-CM | POA: Diagnosis not present

## 2023-04-21 DIAGNOSIS — S2243XA Multiple fractures of ribs, bilateral, initial encounter for closed fracture: Secondary | ICD-10-CM | POA: Diagnosis not present

## 2023-04-21 DIAGNOSIS — R109 Unspecified abdominal pain: Secondary | ICD-10-CM | POA: Diagnosis not present

## 2023-04-21 DIAGNOSIS — R630 Anorexia: Secondary | ICD-10-CM | POA: Diagnosis not present

## 2023-04-21 DIAGNOSIS — S2220XA Unspecified fracture of sternum, initial encounter for closed fracture: Secondary | ICD-10-CM | POA: Diagnosis not present

## 2023-04-21 DIAGNOSIS — Z79899 Other long term (current) drug therapy: Secondary | ICD-10-CM | POA: Diagnosis not present

## 2023-04-21 DIAGNOSIS — J9811 Atelectasis: Secondary | ICD-10-CM | POA: Diagnosis not present

## 2023-04-21 DIAGNOSIS — R110 Nausea: Secondary | ICD-10-CM | POA: Diagnosis not present

## 2023-04-21 DIAGNOSIS — R188 Other ascites: Secondary | ICD-10-CM | POA: Diagnosis not present

## 2023-04-22 DIAGNOSIS — J90 Pleural effusion, not elsewhere classified: Secondary | ICD-10-CM | POA: Diagnosis not present

## 2023-04-22 DIAGNOSIS — S2243XA Multiple fractures of ribs, bilateral, initial encounter for closed fracture: Secondary | ICD-10-CM | POA: Diagnosis not present

## 2023-04-22 DIAGNOSIS — S2220XA Unspecified fracture of sternum, initial encounter for closed fracture: Secondary | ICD-10-CM | POA: Diagnosis not present

## 2023-04-22 DIAGNOSIS — J9811 Atelectasis: Secondary | ICD-10-CM | POA: Diagnosis not present

## 2023-04-23 DIAGNOSIS — S2220XA Unspecified fracture of sternum, initial encounter for closed fracture: Secondary | ICD-10-CM | POA: Diagnosis not present

## 2023-04-23 DIAGNOSIS — J9811 Atelectasis: Secondary | ICD-10-CM | POA: Diagnosis not present

## 2023-04-23 DIAGNOSIS — S2243XA Multiple fractures of ribs, bilateral, initial encounter for closed fracture: Secondary | ICD-10-CM | POA: Diagnosis not present

## 2023-04-23 DIAGNOSIS — J90 Pleural effusion, not elsewhere classified: Secondary | ICD-10-CM | POA: Diagnosis not present

## 2023-04-24 DIAGNOSIS — J9811 Atelectasis: Secondary | ICD-10-CM | POA: Diagnosis not present

## 2023-04-24 DIAGNOSIS — S2220XA Unspecified fracture of sternum, initial encounter for closed fracture: Secondary | ICD-10-CM | POA: Diagnosis not present

## 2023-04-24 DIAGNOSIS — S2243XA Multiple fractures of ribs, bilateral, initial encounter for closed fracture: Secondary | ICD-10-CM | POA: Diagnosis not present

## 2023-04-24 DIAGNOSIS — J90 Pleural effusion, not elsewhere classified: Secondary | ICD-10-CM | POA: Diagnosis not present

## 2023-04-25 DIAGNOSIS — J9811 Atelectasis: Secondary | ICD-10-CM | POA: Diagnosis not present

## 2023-04-25 DIAGNOSIS — E46 Unspecified protein-calorie malnutrition: Secondary | ICD-10-CM | POA: Diagnosis not present

## 2023-04-25 DIAGNOSIS — S2220XA Unspecified fracture of sternum, initial encounter for closed fracture: Secondary | ICD-10-CM | POA: Diagnosis not present

## 2023-04-25 DIAGNOSIS — J90 Pleural effusion, not elsewhere classified: Secondary | ICD-10-CM | POA: Diagnosis not present

## 2023-04-25 DIAGNOSIS — Z515 Encounter for palliative care: Secondary | ICD-10-CM | POA: Diagnosis not present

## 2023-04-25 DIAGNOSIS — S2243XA Multiple fractures of ribs, bilateral, initial encounter for closed fracture: Secondary | ICD-10-CM | POA: Diagnosis not present

## 2023-04-25 DIAGNOSIS — R110 Nausea: Secondary | ICD-10-CM | POA: Diagnosis not present

## 2023-04-25 DIAGNOSIS — R109 Unspecified abdominal pain: Secondary | ICD-10-CM | POA: Diagnosis not present

## 2023-04-26 DIAGNOSIS — R109 Unspecified abdominal pain: Secondary | ICD-10-CM | POA: Diagnosis not present

## 2023-04-26 DIAGNOSIS — S2220XA Unspecified fracture of sternum, initial encounter for closed fracture: Secondary | ICD-10-CM | POA: Diagnosis not present

## 2023-04-26 DIAGNOSIS — Z86718 Personal history of other venous thrombosis and embolism: Secondary | ICD-10-CM | POA: Diagnosis not present

## 2023-04-26 DIAGNOSIS — Z79899 Other long term (current) drug therapy: Secondary | ICD-10-CM | POA: Diagnosis not present

## 2023-04-26 DIAGNOSIS — S2243XA Multiple fractures of ribs, bilateral, initial encounter for closed fracture: Secondary | ICD-10-CM | POA: Diagnosis not present

## 2023-04-26 DIAGNOSIS — J9811 Atelectasis: Secondary | ICD-10-CM | POA: Diagnosis not present

## 2023-04-26 DIAGNOSIS — M7989 Other specified soft tissue disorders: Secondary | ICD-10-CM | POA: Diagnosis not present

## 2023-04-26 DIAGNOSIS — J90 Pleural effusion, not elsewhere classified: Secondary | ICD-10-CM | POA: Diagnosis not present

## 2023-04-26 DIAGNOSIS — R110 Nausea: Secondary | ICD-10-CM | POA: Diagnosis not present

## 2023-04-26 DIAGNOSIS — R630 Anorexia: Secondary | ICD-10-CM | POA: Diagnosis not present

## 2023-04-27 DIAGNOSIS — R109 Unspecified abdominal pain: Secondary | ICD-10-CM | POA: Diagnosis not present

## 2023-04-27 DIAGNOSIS — R112 Nausea with vomiting, unspecified: Secondary | ICD-10-CM | POA: Diagnosis not present

## 2023-04-27 DIAGNOSIS — N390 Urinary tract infection, site not specified: Secondary | ICD-10-CM | POA: Diagnosis not present

## 2023-04-27 DIAGNOSIS — K551 Chronic vascular disorders of intestine: Secondary | ICD-10-CM | POA: Diagnosis not present

## 2023-04-27 DIAGNOSIS — J9811 Atelectasis: Secondary | ICD-10-CM | POA: Diagnosis not present

## 2023-04-27 DIAGNOSIS — Z515 Encounter for palliative care: Secondary | ICD-10-CM | POA: Diagnosis not present

## 2023-04-27 DIAGNOSIS — R110 Nausea: Secondary | ICD-10-CM | POA: Diagnosis not present

## 2023-04-27 DIAGNOSIS — R630 Anorexia: Secondary | ICD-10-CM | POA: Diagnosis not present

## 2023-04-27 DIAGNOSIS — S2243XA Multiple fractures of ribs, bilateral, initial encounter for closed fracture: Secondary | ICD-10-CM | POA: Diagnosis not present

## 2023-04-27 DIAGNOSIS — B379 Candidiasis, unspecified: Secondary | ICD-10-CM | POA: Diagnosis not present

## 2023-04-27 DIAGNOSIS — J90 Pleural effusion, not elsewhere classified: Secondary | ICD-10-CM | POA: Diagnosis not present

## 2023-04-27 DIAGNOSIS — S2220XA Unspecified fracture of sternum, initial encounter for closed fracture: Secondary | ICD-10-CM | POA: Diagnosis not present

## 2023-04-27 DIAGNOSIS — E785 Hyperlipidemia, unspecified: Secondary | ICD-10-CM | POA: Diagnosis not present

## 2023-04-27 DIAGNOSIS — E46 Unspecified protein-calorie malnutrition: Secondary | ICD-10-CM | POA: Diagnosis not present

## 2023-04-27 DIAGNOSIS — I10 Essential (primary) hypertension: Secondary | ICD-10-CM | POA: Diagnosis not present

## 2023-04-27 DIAGNOSIS — E039 Hypothyroidism, unspecified: Secondary | ICD-10-CM | POA: Diagnosis not present

## 2023-04-27 DIAGNOSIS — F05 Delirium due to known physiological condition: Secondary | ICD-10-CM | POA: Diagnosis not present

## 2023-04-28 DIAGNOSIS — B379 Candidiasis, unspecified: Secondary | ICD-10-CM | POA: Diagnosis not present

## 2023-04-28 DIAGNOSIS — K551 Chronic vascular disorders of intestine: Secondary | ICD-10-CM | POA: Diagnosis not present

## 2023-04-28 DIAGNOSIS — E039 Hypothyroidism, unspecified: Secondary | ICD-10-CM | POA: Diagnosis not present

## 2023-04-28 DIAGNOSIS — S2243XA Multiple fractures of ribs, bilateral, initial encounter for closed fracture: Secondary | ICD-10-CM | POA: Diagnosis not present

## 2023-04-28 DIAGNOSIS — J90 Pleural effusion, not elsewhere classified: Secondary | ICD-10-CM | POA: Diagnosis not present

## 2023-04-28 DIAGNOSIS — I10 Essential (primary) hypertension: Secondary | ICD-10-CM | POA: Diagnosis not present

## 2023-04-28 DIAGNOSIS — E785 Hyperlipidemia, unspecified: Secondary | ICD-10-CM | POA: Diagnosis not present

## 2023-04-28 DIAGNOSIS — N390 Urinary tract infection, site not specified: Secondary | ICD-10-CM | POA: Diagnosis not present

## 2023-04-28 DIAGNOSIS — F05 Delirium due to known physiological condition: Secondary | ICD-10-CM | POA: Diagnosis not present

## 2023-04-28 DIAGNOSIS — R109 Unspecified abdominal pain: Secondary | ICD-10-CM | POA: Diagnosis not present

## 2023-04-28 DIAGNOSIS — R630 Anorexia: Secondary | ICD-10-CM | POA: Diagnosis not present

## 2023-04-28 DIAGNOSIS — R110 Nausea: Secondary | ICD-10-CM | POA: Diagnosis not present

## 2023-04-28 DIAGNOSIS — S2220XA Unspecified fracture of sternum, initial encounter for closed fracture: Secondary | ICD-10-CM | POA: Diagnosis not present

## 2023-04-28 DIAGNOSIS — J9811 Atelectasis: Secondary | ICD-10-CM | POA: Diagnosis not present

## 2023-04-28 DIAGNOSIS — Z515 Encounter for palliative care: Secondary | ICD-10-CM | POA: Diagnosis not present

## 2023-04-28 DIAGNOSIS — E46 Unspecified protein-calorie malnutrition: Secondary | ICD-10-CM | POA: Diagnosis not present

## 2023-04-29 DIAGNOSIS — J90 Pleural effusion, not elsewhere classified: Secondary | ICD-10-CM | POA: Diagnosis not present

## 2023-04-29 DIAGNOSIS — S2220XA Unspecified fracture of sternum, initial encounter for closed fracture: Secondary | ICD-10-CM | POA: Diagnosis not present

## 2023-04-29 DIAGNOSIS — S2243XA Multiple fractures of ribs, bilateral, initial encounter for closed fracture: Secondary | ICD-10-CM | POA: Diagnosis not present

## 2023-04-29 DIAGNOSIS — J9811 Atelectasis: Secondary | ICD-10-CM | POA: Diagnosis not present

## 2023-04-30 DIAGNOSIS — J9811 Atelectasis: Secondary | ICD-10-CM | POA: Diagnosis not present

## 2023-04-30 DIAGNOSIS — R109 Unspecified abdominal pain: Secondary | ICD-10-CM | POA: Diagnosis not present

## 2023-04-30 DIAGNOSIS — J90 Pleural effusion, not elsewhere classified: Secondary | ICD-10-CM | POA: Diagnosis not present

## 2023-04-30 DIAGNOSIS — S2220XA Unspecified fracture of sternum, initial encounter for closed fracture: Secondary | ICD-10-CM | POA: Diagnosis not present

## 2023-04-30 DIAGNOSIS — S2243XA Multiple fractures of ribs, bilateral, initial encounter for closed fracture: Secondary | ICD-10-CM | POA: Diagnosis not present

## 2023-05-01 ENCOUNTER — Other Ambulatory Visit: Payer: Self-pay | Admitting: Nurse Practitioner

## 2023-05-01 DIAGNOSIS — S2243XA Multiple fractures of ribs, bilateral, initial encounter for closed fracture: Secondary | ICD-10-CM | POA: Diagnosis not present

## 2023-05-01 DIAGNOSIS — E039 Hypothyroidism, unspecified: Secondary | ICD-10-CM

## 2023-05-01 DIAGNOSIS — J90 Pleural effusion, not elsewhere classified: Secondary | ICD-10-CM | POA: Diagnosis not present

## 2023-05-01 DIAGNOSIS — S2220XA Unspecified fracture of sternum, initial encounter for closed fracture: Secondary | ICD-10-CM | POA: Diagnosis not present

## 2023-05-01 DIAGNOSIS — J9811 Atelectasis: Secondary | ICD-10-CM | POA: Diagnosis not present

## 2023-05-02 DIAGNOSIS — R93421 Abnormal radiologic findings on diagnostic imaging of right kidney: Secondary | ICD-10-CM | POA: Diagnosis not present

## 2023-05-02 DIAGNOSIS — S2220XA Unspecified fracture of sternum, initial encounter for closed fracture: Secondary | ICD-10-CM | POA: Diagnosis not present

## 2023-05-02 DIAGNOSIS — S3991XA Unspecified injury of abdomen, initial encounter: Secondary | ICD-10-CM | POA: Diagnosis not present

## 2023-05-02 DIAGNOSIS — J90 Pleural effusion, not elsewhere classified: Secondary | ICD-10-CM | POA: Diagnosis not present

## 2023-05-02 DIAGNOSIS — R109 Unspecified abdominal pain: Secondary | ICD-10-CM | POA: Diagnosis not present

## 2023-05-02 DIAGNOSIS — S2243XA Multiple fractures of ribs, bilateral, initial encounter for closed fracture: Secondary | ICD-10-CM | POA: Diagnosis not present

## 2023-05-02 DIAGNOSIS — R1011 Right upper quadrant pain: Secondary | ICD-10-CM | POA: Diagnosis not present

## 2023-05-02 DIAGNOSIS — J9811 Atelectasis: Secondary | ICD-10-CM | POA: Diagnosis not present

## 2023-05-02 DIAGNOSIS — T07XXXD Unspecified multiple injuries, subsequent encounter: Secondary | ICD-10-CM | POA: Diagnosis not present

## 2023-05-02 DIAGNOSIS — S3993XA Unspecified injury of pelvis, initial encounter: Secondary | ICD-10-CM | POA: Diagnosis not present

## 2023-05-02 DIAGNOSIS — S27321A Contusion of lung, unilateral, initial encounter: Secondary | ICD-10-CM | POA: Diagnosis not present

## 2023-05-02 DIAGNOSIS — Z515 Encounter for palliative care: Secondary | ICD-10-CM | POA: Diagnosis not present

## 2023-05-03 DIAGNOSIS — Z515 Encounter for palliative care: Secondary | ICD-10-CM | POA: Diagnosis not present

## 2023-05-03 DIAGNOSIS — J90 Pleural effusion, not elsewhere classified: Secondary | ICD-10-CM | POA: Diagnosis not present

## 2023-05-03 DIAGNOSIS — R109 Unspecified abdominal pain: Secondary | ICD-10-CM | POA: Diagnosis not present

## 2023-05-03 DIAGNOSIS — S2220XA Unspecified fracture of sternum, initial encounter for closed fracture: Secondary | ICD-10-CM | POA: Diagnosis not present

## 2023-05-03 DIAGNOSIS — J9811 Atelectasis: Secondary | ICD-10-CM | POA: Diagnosis not present

## 2023-05-03 DIAGNOSIS — T07XXXD Unspecified multiple injuries, subsequent encounter: Secondary | ICD-10-CM | POA: Diagnosis not present

## 2023-05-03 DIAGNOSIS — S2243XA Multiple fractures of ribs, bilateral, initial encounter for closed fracture: Secondary | ICD-10-CM | POA: Diagnosis not present

## 2023-05-04 DIAGNOSIS — S92061A Displaced intraarticular fracture of right calcaneus, initial encounter for closed fracture: Secondary | ICD-10-CM | POA: Diagnosis not present

## 2023-05-04 DIAGNOSIS — S2220XA Unspecified fracture of sternum, initial encounter for closed fracture: Secondary | ICD-10-CM | POA: Diagnosis not present

## 2023-05-04 DIAGNOSIS — R109 Unspecified abdominal pain: Secondary | ICD-10-CM | POA: Diagnosis not present

## 2023-05-04 DIAGNOSIS — Z515 Encounter for palliative care: Secondary | ICD-10-CM | POA: Diagnosis not present

## 2023-05-04 DIAGNOSIS — S52002D Unspecified fracture of upper end of left ulna, subsequent encounter for closed fracture with routine healing: Secondary | ICD-10-CM | POA: Diagnosis not present

## 2023-05-04 DIAGNOSIS — T07XXXD Unspecified multiple injuries, subsequent encounter: Secondary | ICD-10-CM | POA: Diagnosis not present

## 2023-05-04 DIAGNOSIS — J90 Pleural effusion, not elsewhere classified: Secondary | ICD-10-CM | POA: Diagnosis not present

## 2023-05-04 DIAGNOSIS — J9811 Atelectasis: Secondary | ICD-10-CM | POA: Diagnosis not present

## 2023-05-04 DIAGNOSIS — S2243XA Multiple fractures of ribs, bilateral, initial encounter for closed fracture: Secondary | ICD-10-CM | POA: Diagnosis not present

## 2023-05-05 DIAGNOSIS — S2243XA Multiple fractures of ribs, bilateral, initial encounter for closed fracture: Secondary | ICD-10-CM | POA: Diagnosis not present

## 2023-05-05 DIAGNOSIS — S2220XA Unspecified fracture of sternum, initial encounter for closed fracture: Secondary | ICD-10-CM | POA: Diagnosis not present

## 2023-05-05 DIAGNOSIS — J9811 Atelectasis: Secondary | ICD-10-CM | POA: Diagnosis not present

## 2023-05-05 DIAGNOSIS — J90 Pleural effusion, not elsewhere classified: Secondary | ICD-10-CM | POA: Diagnosis not present

## 2023-05-06 DIAGNOSIS — D72829 Elevated white blood cell count, unspecified: Secondary | ICD-10-CM | POA: Diagnosis not present

## 2023-05-06 DIAGNOSIS — J9811 Atelectasis: Secondary | ICD-10-CM | POA: Diagnosis not present

## 2023-05-06 DIAGNOSIS — B965 Pseudomonas (aeruginosa) (mallei) (pseudomallei) as the cause of diseases classified elsewhere: Secondary | ICD-10-CM | POA: Diagnosis not present

## 2023-05-06 DIAGNOSIS — S2243XA Multiple fractures of ribs, bilateral, initial encounter for closed fracture: Secondary | ICD-10-CM | POA: Diagnosis not present

## 2023-05-06 DIAGNOSIS — S2220XA Unspecified fracture of sternum, initial encounter for closed fracture: Secondary | ICD-10-CM | POA: Diagnosis not present

## 2023-05-06 DIAGNOSIS — J90 Pleural effusion, not elsewhere classified: Secondary | ICD-10-CM | POA: Diagnosis not present

## 2023-05-07 DIAGNOSIS — B965 Pseudomonas (aeruginosa) (mallei) (pseudomallei) as the cause of diseases classified elsewhere: Secondary | ICD-10-CM | POA: Diagnosis not present

## 2023-05-07 DIAGNOSIS — J90 Pleural effusion, not elsewhere classified: Secondary | ICD-10-CM | POA: Diagnosis not present

## 2023-05-07 DIAGNOSIS — S2243XA Multiple fractures of ribs, bilateral, initial encounter for closed fracture: Secondary | ICD-10-CM | POA: Diagnosis not present

## 2023-05-07 DIAGNOSIS — D72829 Elevated white blood cell count, unspecified: Secondary | ICD-10-CM | POA: Diagnosis not present

## 2023-05-07 DIAGNOSIS — S2220XA Unspecified fracture of sternum, initial encounter for closed fracture: Secondary | ICD-10-CM | POA: Diagnosis not present

## 2023-05-07 DIAGNOSIS — J9811 Atelectasis: Secondary | ICD-10-CM | POA: Diagnosis not present

## 2023-05-08 DIAGNOSIS — J9811 Atelectasis: Secondary | ICD-10-CM | POA: Diagnosis not present

## 2023-05-08 DIAGNOSIS — R110 Nausea: Secondary | ICD-10-CM | POA: Diagnosis not present

## 2023-05-08 DIAGNOSIS — J90 Pleural effusion, not elsewhere classified: Secondary | ICD-10-CM | POA: Diagnosis not present

## 2023-05-15 ENCOUNTER — Ambulatory Visit: Payer: Medicare HMO | Admitting: Nurse Practitioner

## 2023-05-18 DIAGNOSIS — R109 Unspecified abdominal pain: Secondary | ICD-10-CM | POA: Diagnosis not present

## 2023-05-18 DIAGNOSIS — K55059 Acute (reversible) ischemia of intestine, part and extent unspecified: Secondary | ICD-10-CM | POA: Diagnosis not present

## 2023-05-18 DIAGNOSIS — Z515 Encounter for palliative care: Secondary | ICD-10-CM | POA: Diagnosis not present

## 2023-05-20 DIAGNOSIS — E039 Hypothyroidism, unspecified: Secondary | ICD-10-CM | POA: Diagnosis not present

## 2023-05-20 DIAGNOSIS — S2243XA Multiple fractures of ribs, bilateral, initial encounter for closed fracture: Secondary | ICD-10-CM | POA: Diagnosis not present

## 2023-05-20 DIAGNOSIS — S15191A Other specified injury of right vertebral artery, initial encounter: Secondary | ICD-10-CM | POA: Diagnosis not present

## 2023-05-20 DIAGNOSIS — I10 Essential (primary) hypertension: Secondary | ICD-10-CM | POA: Diagnosis not present

## 2023-05-22 DIAGNOSIS — E039 Hypothyroidism, unspecified: Secondary | ICD-10-CM | POA: Diagnosis not present

## 2023-05-22 DIAGNOSIS — I10 Essential (primary) hypertension: Secondary | ICD-10-CM | POA: Diagnosis not present

## 2023-05-22 DIAGNOSIS — S2243XA Multiple fractures of ribs, bilateral, initial encounter for closed fracture: Secondary | ICD-10-CM | POA: Diagnosis not present

## 2023-05-22 DIAGNOSIS — S15191A Other specified injury of right vertebral artery, initial encounter: Secondary | ICD-10-CM | POA: Diagnosis not present

## 2023-05-23 DIAGNOSIS — I10 Essential (primary) hypertension: Secondary | ICD-10-CM | POA: Diagnosis not present

## 2023-05-23 DIAGNOSIS — S15191A Other specified injury of right vertebral artery, initial encounter: Secondary | ICD-10-CM | POA: Diagnosis not present

## 2023-05-23 DIAGNOSIS — S2243XA Multiple fractures of ribs, bilateral, initial encounter for closed fracture: Secondary | ICD-10-CM | POA: Diagnosis not present

## 2023-05-23 DIAGNOSIS — E039 Hypothyroidism, unspecified: Secondary | ICD-10-CM | POA: Diagnosis not present

## 2023-05-24 DIAGNOSIS — I10 Essential (primary) hypertension: Secondary | ICD-10-CM | POA: Diagnosis not present

## 2023-05-24 DIAGNOSIS — E039 Hypothyroidism, unspecified: Secondary | ICD-10-CM | POA: Diagnosis not present

## 2023-05-24 DIAGNOSIS — S15191A Other specified injury of right vertebral artery, initial encounter: Secondary | ICD-10-CM | POA: Diagnosis not present

## 2023-05-24 DIAGNOSIS — S2243XA Multiple fractures of ribs, bilateral, initial encounter for closed fracture: Secondary | ICD-10-CM | POA: Diagnosis not present

## 2023-05-25 DIAGNOSIS — E039 Hypothyroidism, unspecified: Secondary | ICD-10-CM | POA: Diagnosis not present

## 2023-05-25 DIAGNOSIS — S15191A Other specified injury of right vertebral artery, initial encounter: Secondary | ICD-10-CM | POA: Diagnosis not present

## 2023-05-25 DIAGNOSIS — I10 Essential (primary) hypertension: Secondary | ICD-10-CM | POA: Diagnosis not present

## 2023-05-25 DIAGNOSIS — S2243XA Multiple fractures of ribs, bilateral, initial encounter for closed fracture: Secondary | ICD-10-CM | POA: Diagnosis not present

## 2023-05-26 DIAGNOSIS — E039 Hypothyroidism, unspecified: Secondary | ICD-10-CM | POA: Diagnosis not present

## 2023-05-26 DIAGNOSIS — S2243XA Multiple fractures of ribs, bilateral, initial encounter for closed fracture: Secondary | ICD-10-CM | POA: Diagnosis not present

## 2023-05-26 DIAGNOSIS — S15191A Other specified injury of right vertebral artery, initial encounter: Secondary | ICD-10-CM | POA: Diagnosis not present

## 2023-05-26 DIAGNOSIS — I10 Essential (primary) hypertension: Secondary | ICD-10-CM | POA: Diagnosis not present

## 2023-05-27 DIAGNOSIS — E039 Hypothyroidism, unspecified: Secondary | ICD-10-CM | POA: Diagnosis not present

## 2023-05-27 DIAGNOSIS — I10 Essential (primary) hypertension: Secondary | ICD-10-CM | POA: Diagnosis not present

## 2023-05-27 DIAGNOSIS — S15191A Other specified injury of right vertebral artery, initial encounter: Secondary | ICD-10-CM | POA: Diagnosis not present

## 2023-05-27 DIAGNOSIS — S2243XA Multiple fractures of ribs, bilateral, initial encounter for closed fracture: Secondary | ICD-10-CM | POA: Diagnosis not present

## 2023-05-28 DIAGNOSIS — S15191A Other specified injury of right vertebral artery, initial encounter: Secondary | ICD-10-CM | POA: Diagnosis not present

## 2023-05-28 DIAGNOSIS — E039 Hypothyroidism, unspecified: Secondary | ICD-10-CM | POA: Diagnosis not present

## 2023-05-28 DIAGNOSIS — I10 Essential (primary) hypertension: Secondary | ICD-10-CM | POA: Diagnosis not present

## 2023-05-28 DIAGNOSIS — S2243XA Multiple fractures of ribs, bilateral, initial encounter for closed fracture: Secondary | ICD-10-CM | POA: Diagnosis not present

## 2023-05-29 DIAGNOSIS — T8130XA Disruption of wound, unspecified, initial encounter: Secondary | ICD-10-CM | POA: Diagnosis not present

## 2023-05-29 DIAGNOSIS — E039 Hypothyroidism, unspecified: Secondary | ICD-10-CM | POA: Diagnosis not present

## 2023-05-29 DIAGNOSIS — I10 Essential (primary) hypertension: Secondary | ICD-10-CM | POA: Diagnosis not present

## 2023-05-29 DIAGNOSIS — I82A11 Acute embolism and thrombosis of right axillary vein: Secondary | ICD-10-CM | POA: Diagnosis not present

## 2023-05-29 DIAGNOSIS — I7774 Dissection of vertebral artery: Secondary | ICD-10-CM | POA: Diagnosis not present

## 2023-05-29 DIAGNOSIS — S92001A Unspecified fracture of right calcaneus, initial encounter for closed fracture: Secondary | ICD-10-CM | POA: Diagnosis not present

## 2023-05-29 DIAGNOSIS — S52022A Displaced fracture of olecranon process without intraarticular extension of left ulna, initial encounter for closed fracture: Secondary | ICD-10-CM | POA: Diagnosis not present

## 2023-05-30 DIAGNOSIS — Z743 Need for continuous supervision: Secondary | ICD-10-CM | POA: Diagnosis not present

## 2023-05-30 DIAGNOSIS — I82A11 Acute embolism and thrombosis of right axillary vein: Secondary | ICD-10-CM | POA: Diagnosis not present

## 2023-05-30 DIAGNOSIS — R627 Adult failure to thrive: Secondary | ICD-10-CM | POA: Diagnosis not present

## 2023-05-30 DIAGNOSIS — S92001D Unspecified fracture of right calcaneus, subsequent encounter for fracture with routine healing: Secondary | ICD-10-CM | POA: Diagnosis not present

## 2023-05-30 DIAGNOSIS — S52022D Displaced fracture of olecranon process without intraarticular extension of left ulna, subsequent encounter for closed fracture with routine healing: Secondary | ICD-10-CM | POA: Diagnosis not present

## 2023-05-30 DIAGNOSIS — S2243XD Multiple fractures of ribs, bilateral, subsequent encounter for fracture with routine healing: Secondary | ICD-10-CM | POA: Diagnosis not present

## 2023-05-30 DIAGNOSIS — S92061D Displaced intraarticular fracture of right calcaneus, subsequent encounter for fracture with routine healing: Secondary | ICD-10-CM | POA: Diagnosis not present

## 2023-05-30 DIAGNOSIS — R0689 Other abnormalities of breathing: Secondary | ICD-10-CM | POA: Diagnosis not present

## 2023-05-30 DIAGNOSIS — R109 Unspecified abdominal pain: Secondary | ICD-10-CM | POA: Diagnosis not present

## 2023-05-30 DIAGNOSIS — S2249XD Multiple fractures of ribs, unspecified side, subsequent encounter for fracture with routine healing: Secondary | ICD-10-CM | POA: Diagnosis not present

## 2023-05-30 DIAGNOSIS — Z433 Encounter for attention to colostomy: Secondary | ICD-10-CM | POA: Diagnosis not present

## 2023-05-30 DIAGNOSIS — S15199D Other specified injury of unspecified vertebral artery, subsequent encounter: Secondary | ICD-10-CM | POA: Diagnosis not present

## 2023-05-30 DIAGNOSIS — S37032D Laceration of left kidney, unspecified degree, subsequent encounter: Secondary | ICD-10-CM | POA: Diagnosis not present

## 2023-05-30 DIAGNOSIS — E876 Hypokalemia: Secondary | ICD-10-CM | POA: Diagnosis not present

## 2023-05-30 DIAGNOSIS — S36892D Contusion of other intra-abdominal organs, subsequent encounter: Secondary | ICD-10-CM | POA: Diagnosis not present

## 2023-05-30 DIAGNOSIS — E039 Hypothyroidism, unspecified: Secondary | ICD-10-CM | POA: Diagnosis not present

## 2023-05-30 DIAGNOSIS — F05 Delirium due to known physiological condition: Secondary | ICD-10-CM | POA: Diagnosis not present

## 2023-05-30 DIAGNOSIS — E559 Vitamin D deficiency, unspecified: Secondary | ICD-10-CM | POA: Diagnosis not present

## 2023-05-30 DIAGNOSIS — F5101 Primary insomnia: Secondary | ICD-10-CM | POA: Diagnosis not present

## 2023-05-30 DIAGNOSIS — S53095D Other dislocation of left radial head, subsequent encounter: Secondary | ICD-10-CM | POA: Diagnosis not present

## 2023-05-30 DIAGNOSIS — I959 Hypotension, unspecified: Secondary | ICD-10-CM | POA: Diagnosis not present

## 2023-05-30 DIAGNOSIS — R1311 Dysphagia, oral phase: Secondary | ICD-10-CM | POA: Diagnosis not present

## 2023-05-30 DIAGNOSIS — R0989 Other specified symptoms and signs involving the circulatory and respiratory systems: Secondary | ICD-10-CM | POA: Diagnosis not present

## 2023-05-30 DIAGNOSIS — G8921 Chronic pain due to trauma: Secondary | ICD-10-CM | POA: Diagnosis not present

## 2023-05-30 DIAGNOSIS — S52252D Displaced comminuted fracture of shaft of ulna, left arm, subsequent encounter for closed fracture with routine healing: Secondary | ICD-10-CM | POA: Diagnosis not present

## 2023-05-30 DIAGNOSIS — R278 Other lack of coordination: Secondary | ICD-10-CM | POA: Diagnosis not present

## 2023-05-30 DIAGNOSIS — R4189 Other symptoms and signs involving cognitive functions and awareness: Secondary | ICD-10-CM | POA: Diagnosis not present

## 2023-05-30 DIAGNOSIS — S53005D Unspecified dislocation of left radial head, subsequent encounter: Secondary | ICD-10-CM | POA: Diagnosis not present

## 2023-05-30 DIAGNOSIS — S92001A Unspecified fracture of right calcaneus, initial encounter for closed fracture: Secondary | ICD-10-CM | POA: Diagnosis not present

## 2023-05-30 DIAGNOSIS — R262 Difficulty in walking, not elsewhere classified: Secondary | ICD-10-CM | POA: Diagnosis not present

## 2023-05-30 DIAGNOSIS — M6281 Muscle weakness (generalized): Secondary | ICD-10-CM | POA: Diagnosis not present

## 2023-05-30 DIAGNOSIS — G4700 Insomnia, unspecified: Secondary | ICD-10-CM | POA: Diagnosis not present

## 2023-05-30 DIAGNOSIS — R2689 Other abnormalities of gait and mobility: Secondary | ICD-10-CM | POA: Diagnosis not present

## 2023-05-30 DIAGNOSIS — S92404S Nondisplaced unspecified fracture of right great toe, sequela: Secondary | ICD-10-CM | POA: Diagnosis not present

## 2023-05-30 DIAGNOSIS — I10 Essential (primary) hypertension: Secondary | ICD-10-CM | POA: Diagnosis not present

## 2023-05-30 DIAGNOSIS — E538 Deficiency of other specified B group vitamins: Secondary | ICD-10-CM | POA: Diagnosis not present

## 2023-05-30 DIAGNOSIS — S52002D Unspecified fracture of upper end of left ulna, subsequent encounter for closed fracture with routine healing: Secondary | ICD-10-CM | POA: Diagnosis not present

## 2023-05-30 DIAGNOSIS — S31603D Unspecified open wound of abdominal wall, right lower quadrant with penetration into peritoneal cavity, subsequent encounter: Secondary | ICD-10-CM | POA: Diagnosis not present

## 2023-05-30 DIAGNOSIS — S36899D Unspecified injury of other intra-abdominal organs, subsequent encounter: Secondary | ICD-10-CM | POA: Diagnosis not present

## 2023-05-30 DIAGNOSIS — M19071 Primary osteoarthritis, right ankle and foot: Secondary | ICD-10-CM | POA: Diagnosis not present

## 2023-05-30 DIAGNOSIS — R5381 Other malaise: Secondary | ICD-10-CM | POA: Diagnosis not present

## 2023-05-30 DIAGNOSIS — X58XXXD Exposure to other specified factors, subsequent encounter: Secondary | ICD-10-CM | POA: Diagnosis not present

## 2023-05-30 DIAGNOSIS — S36409D Unspecified injury of unspecified part of small intestine, subsequent encounter: Secondary | ICD-10-CM | POA: Diagnosis not present

## 2023-05-30 DIAGNOSIS — S31109S Unspecified open wound of abdominal wall, unspecified quadrant without penetration into peritoneal cavity, sequela: Secondary | ICD-10-CM | POA: Diagnosis not present

## 2023-05-30 DIAGNOSIS — S2232XD Fracture of one rib, left side, subsequent encounter for fracture with routine healing: Secondary | ICD-10-CM | POA: Diagnosis not present

## 2023-05-30 DIAGNOSIS — K219 Gastro-esophageal reflux disease without esophagitis: Secondary | ICD-10-CM | POA: Diagnosis not present

## 2023-05-30 DIAGNOSIS — N390 Urinary tract infection, site not specified: Secondary | ICD-10-CM | POA: Diagnosis not present

## 2023-05-30 DIAGNOSIS — S36538D Laceration of other part of colon, subsequent encounter: Secondary | ICD-10-CM | POA: Diagnosis not present

## 2023-05-30 DIAGNOSIS — J9811 Atelectasis: Secondary | ICD-10-CM | POA: Diagnosis not present

## 2023-05-30 DIAGNOSIS — K5900 Constipation, unspecified: Secondary | ICD-10-CM | POA: Diagnosis not present

## 2023-05-30 DIAGNOSIS — S52009Q Unspecified fracture of upper end of unspecified ulna, subsequent encounter for open fracture type I or II with malunion: Secondary | ICD-10-CM | POA: Diagnosis not present

## 2023-05-30 DIAGNOSIS — M79671 Pain in right foot: Secondary | ICD-10-CM | POA: Diagnosis not present

## 2023-05-30 DIAGNOSIS — E038 Other specified hypothyroidism: Secondary | ICD-10-CM | POA: Diagnosis not present

## 2023-05-30 DIAGNOSIS — J90 Pleural effusion, not elsewhere classified: Secondary | ICD-10-CM | POA: Diagnosis not present

## 2023-05-30 DIAGNOSIS — S92062D Displaced intraarticular fracture of left calcaneus, subsequent encounter for fracture with routine healing: Secondary | ICD-10-CM | POA: Diagnosis not present

## 2023-05-30 DIAGNOSIS — T07XXXA Unspecified multiple injuries, initial encounter: Secondary | ICD-10-CM | POA: Diagnosis not present

## 2023-05-30 DIAGNOSIS — I82409 Acute embolism and thrombosis of unspecified deep veins of unspecified lower extremity: Secondary | ICD-10-CM | POA: Diagnosis not present

## 2023-05-30 DIAGNOSIS — K5909 Other constipation: Secondary | ICD-10-CM | POA: Diagnosis not present

## 2023-05-30 DIAGNOSIS — R918 Other nonspecific abnormal finding of lung field: Secondary | ICD-10-CM | POA: Diagnosis not present

## 2023-05-30 DIAGNOSIS — S36428D Contusion of other part of small intestine, subsequent encounter: Secondary | ICD-10-CM | POA: Diagnosis not present

## 2023-05-30 DIAGNOSIS — S2243XA Multiple fractures of ribs, bilateral, initial encounter for closed fracture: Secondary | ICD-10-CM | POA: Diagnosis not present

## 2023-05-30 DIAGNOSIS — S2221XD Fracture of manubrium, subsequent encounter for fracture with routine healing: Secondary | ICD-10-CM | POA: Diagnosis not present

## 2023-05-30 DIAGNOSIS — R488 Other symbolic dysfunctions: Secondary | ICD-10-CM | POA: Diagnosis not present

## 2023-06-06 DIAGNOSIS — R627 Adult failure to thrive: Secondary | ICD-10-CM | POA: Diagnosis not present

## 2023-06-06 DIAGNOSIS — F05 Delirium due to known physiological condition: Secondary | ICD-10-CM | POA: Diagnosis not present

## 2023-06-06 DIAGNOSIS — S36409D Unspecified injury of unspecified part of small intestine, subsequent encounter: Secondary | ICD-10-CM | POA: Diagnosis not present

## 2023-06-06 DIAGNOSIS — I82A11 Acute embolism and thrombosis of right axillary vein: Secondary | ICD-10-CM | POA: Diagnosis not present

## 2023-06-06 DIAGNOSIS — R5381 Other malaise: Secondary | ICD-10-CM | POA: Diagnosis not present

## 2023-06-06 DIAGNOSIS — S52009Q Unspecified fracture of upper end of unspecified ulna, subsequent encounter for open fracture type I or II with malunion: Secondary | ICD-10-CM | POA: Diagnosis not present

## 2023-06-06 DIAGNOSIS — S36899D Unspecified injury of other intra-abdominal organs, subsequent encounter: Secondary | ICD-10-CM | POA: Diagnosis not present

## 2023-06-13 ENCOUNTER — Telehealth: Payer: Self-pay | Admitting: Nurse Practitioner

## 2023-06-13 NOTE — Telephone Encounter (Addendum)
S/w pt daughter regarding scheduling pt but pt is still currently in rehab-nm

## 2023-06-14 ENCOUNTER — Telehealth: Payer: Self-pay

## 2023-06-14 DIAGNOSIS — R262 Difficulty in walking, not elsewhere classified: Secondary | ICD-10-CM | POA: Diagnosis not present

## 2023-06-14 DIAGNOSIS — M79671 Pain in right foot: Secondary | ICD-10-CM | POA: Diagnosis not present

## 2023-06-14 DIAGNOSIS — S92001D Unspecified fracture of right calcaneus, subsequent encounter for fracture with routine healing: Secondary | ICD-10-CM | POA: Diagnosis not present

## 2023-06-14 DIAGNOSIS — S92404S Nondisplaced unspecified fracture of right great toe, sequela: Secondary | ICD-10-CM | POA: Diagnosis not present

## 2023-06-14 NOTE — Telephone Encounter (Signed)
Pt daughter called that her mom is in rehab and she was off her thyroid med off for 2 weeks and they restart her back as per Arlyss Repress is ok we will recheck when she discharge from rehab

## 2023-06-15 DIAGNOSIS — S92001D Unspecified fracture of right calcaneus, subsequent encounter for fracture with routine healing: Secondary | ICD-10-CM | POA: Diagnosis not present

## 2023-06-15 DIAGNOSIS — S92404S Nondisplaced unspecified fracture of right great toe, sequela: Secondary | ICD-10-CM | POA: Diagnosis not present

## 2023-06-15 DIAGNOSIS — M79671 Pain in right foot: Secondary | ICD-10-CM | POA: Diagnosis not present

## 2023-06-19 DIAGNOSIS — S92001D Unspecified fracture of right calcaneus, subsequent encounter for fracture with routine healing: Secondary | ICD-10-CM | POA: Diagnosis not present

## 2023-06-19 DIAGNOSIS — S92404S Nondisplaced unspecified fracture of right great toe, sequela: Secondary | ICD-10-CM | POA: Diagnosis not present

## 2023-06-19 DIAGNOSIS — M79671 Pain in right foot: Secondary | ICD-10-CM | POA: Diagnosis not present

## 2023-06-19 DIAGNOSIS — R262 Difficulty in walking, not elsewhere classified: Secondary | ICD-10-CM | POA: Diagnosis not present

## 2023-06-20 DIAGNOSIS — S36409D Unspecified injury of unspecified part of small intestine, subsequent encounter: Secondary | ICD-10-CM | POA: Diagnosis not present

## 2023-06-20 DIAGNOSIS — I82A11 Acute embolism and thrombosis of right axillary vein: Secondary | ICD-10-CM | POA: Diagnosis not present

## 2023-06-20 DIAGNOSIS — F05 Delirium due to known physiological condition: Secondary | ICD-10-CM | POA: Diagnosis not present

## 2023-06-20 DIAGNOSIS — R5381 Other malaise: Secondary | ICD-10-CM | POA: Diagnosis not present

## 2023-06-20 DIAGNOSIS — S36899D Unspecified injury of other intra-abdominal organs, subsequent encounter: Secondary | ICD-10-CM | POA: Diagnosis not present

## 2023-06-20 DIAGNOSIS — S52009Q Unspecified fracture of upper end of unspecified ulna, subsequent encounter for open fracture type I or II with malunion: Secondary | ICD-10-CM | POA: Diagnosis not present

## 2023-06-20 DIAGNOSIS — R627 Adult failure to thrive: Secondary | ICD-10-CM | POA: Diagnosis not present

## 2023-06-22 DIAGNOSIS — X58XXXD Exposure to other specified factors, subsequent encounter: Secondary | ICD-10-CM | POA: Diagnosis not present

## 2023-06-22 DIAGNOSIS — M19071 Primary osteoarthritis, right ankle and foot: Secondary | ICD-10-CM | POA: Diagnosis not present

## 2023-06-22 DIAGNOSIS — S92001A Unspecified fracture of right calcaneus, initial encounter for closed fracture: Secondary | ICD-10-CM | POA: Diagnosis not present

## 2023-06-22 DIAGNOSIS — S52002D Unspecified fracture of upper end of left ulna, subsequent encounter for closed fracture with routine healing: Secondary | ICD-10-CM | POA: Diagnosis not present

## 2023-06-22 DIAGNOSIS — S92001D Unspecified fracture of right calcaneus, subsequent encounter for fracture with routine healing: Secondary | ICD-10-CM | POA: Diagnosis not present

## 2023-06-22 DIAGNOSIS — S52022D Displaced fracture of olecranon process without intraarticular extension of left ulna, subsequent encounter for closed fracture with routine healing: Secondary | ICD-10-CM | POA: Diagnosis not present

## 2023-07-04 DIAGNOSIS — F05 Delirium due to known physiological condition: Secondary | ICD-10-CM | POA: Diagnosis not present

## 2023-07-04 DIAGNOSIS — G8921 Chronic pain due to trauma: Secondary | ICD-10-CM | POA: Diagnosis not present

## 2023-07-04 DIAGNOSIS — I824Y9 Acute embolism and thrombosis of unspecified deep veins of unspecified proximal lower extremity: Secondary | ICD-10-CM | POA: Diagnosis not present

## 2023-07-04 DIAGNOSIS — S36892D Contusion of other intra-abdominal organs, subsequent encounter: Secondary | ICD-10-CM | POA: Diagnosis not present

## 2023-07-04 DIAGNOSIS — E539 Vitamin B deficiency, unspecified: Secondary | ICD-10-CM | POA: Diagnosis not present

## 2023-07-04 DIAGNOSIS — I959 Hypotension, unspecified: Secondary | ICD-10-CM | POA: Diagnosis not present

## 2023-07-04 DIAGNOSIS — S52022D Displaced fracture of olecranon process without intraarticular extension of left ulna, subsequent encounter for closed fracture with routine healing: Secondary | ICD-10-CM | POA: Diagnosis not present

## 2023-07-04 DIAGNOSIS — R2689 Other abnormalities of gait and mobility: Secondary | ICD-10-CM | POA: Diagnosis not present

## 2023-07-04 DIAGNOSIS — E039 Hypothyroidism, unspecified: Secondary | ICD-10-CM | POA: Diagnosis not present

## 2023-07-04 DIAGNOSIS — S37032D Laceration of left kidney, unspecified degree, subsequent encounter: Secondary | ICD-10-CM | POA: Diagnosis not present

## 2023-07-04 DIAGNOSIS — S15199D Other specified injury of unspecified vertebral artery, subsequent encounter: Secondary | ICD-10-CM | POA: Diagnosis not present

## 2023-07-04 DIAGNOSIS — R627 Adult failure to thrive: Secondary | ICD-10-CM | POA: Diagnosis not present

## 2023-07-04 DIAGNOSIS — Z933 Colostomy status: Secondary | ICD-10-CM | POA: Diagnosis not present

## 2023-07-04 DIAGNOSIS — R1311 Dysphagia, oral phase: Secondary | ICD-10-CM | POA: Diagnosis not present

## 2023-07-04 DIAGNOSIS — K219 Gastro-esophageal reflux disease without esophagitis: Secondary | ICD-10-CM | POA: Diagnosis not present

## 2023-07-04 DIAGNOSIS — S92061D Displaced intraarticular fracture of right calcaneus, subsequent encounter for fracture with routine healing: Secondary | ICD-10-CM | POA: Diagnosis not present

## 2023-07-04 DIAGNOSIS — I10 Essential (primary) hypertension: Secondary | ICD-10-CM | POA: Diagnosis not present

## 2023-07-04 DIAGNOSIS — S52002D Unspecified fracture of upper end of left ulna, subsequent encounter for closed fracture with routine healing: Secondary | ICD-10-CM | POA: Diagnosis not present

## 2023-07-04 DIAGNOSIS — R4189 Other symptoms and signs involving cognitive functions and awareness: Secondary | ICD-10-CM | POA: Diagnosis not present

## 2023-07-04 DIAGNOSIS — I82A11 Acute embolism and thrombosis of right axillary vein: Secondary | ICD-10-CM | POA: Diagnosis not present

## 2023-07-04 DIAGNOSIS — S36538D Laceration of other part of colon, subsequent encounter: Secondary | ICD-10-CM | POA: Diagnosis not present

## 2023-07-04 DIAGNOSIS — R5381 Other malaise: Secondary | ICD-10-CM | POA: Diagnosis not present

## 2023-07-04 DIAGNOSIS — S2249XD Multiple fractures of ribs, unspecified side, subsequent encounter for fracture with routine healing: Secondary | ICD-10-CM | POA: Diagnosis not present

## 2023-07-04 DIAGNOSIS — Z743 Need for continuous supervision: Secondary | ICD-10-CM | POA: Diagnosis not present

## 2023-07-04 DIAGNOSIS — D649 Anemia, unspecified: Secondary | ICD-10-CM | POA: Diagnosis not present

## 2023-07-04 DIAGNOSIS — S36428D Contusion of other part of small intestine, subsequent encounter: Secondary | ICD-10-CM | POA: Diagnosis not present

## 2023-07-04 DIAGNOSIS — S53095D Other dislocation of left radial head, subsequent encounter: Secondary | ICD-10-CM | POA: Diagnosis not present

## 2023-07-04 DIAGNOSIS — K5900 Constipation, unspecified: Secondary | ICD-10-CM | POA: Diagnosis not present

## 2023-07-04 DIAGNOSIS — E559 Vitamin D deficiency, unspecified: Secondary | ICD-10-CM | POA: Diagnosis not present

## 2023-07-04 DIAGNOSIS — S2221XD Fracture of manubrium, subsequent encounter for fracture with routine healing: Secondary | ICD-10-CM | POA: Diagnosis not present

## 2023-07-04 DIAGNOSIS — R918 Other nonspecific abnormal finding of lung field: Secondary | ICD-10-CM | POA: Diagnosis not present

## 2023-07-04 DIAGNOSIS — Z9049 Acquired absence of other specified parts of digestive tract: Secondary | ICD-10-CM | POA: Diagnosis not present

## 2023-07-04 DIAGNOSIS — S31603D Unspecified open wound of abdominal wall, right lower quadrant with penetration into peritoneal cavity, subsequent encounter: Secondary | ICD-10-CM | POA: Diagnosis not present

## 2023-07-04 DIAGNOSIS — G4700 Insomnia, unspecified: Secondary | ICD-10-CM | POA: Diagnosis not present

## 2023-07-07 DIAGNOSIS — B3789 Other sites of candidiasis: Secondary | ICD-10-CM | POA: Diagnosis not present

## 2023-07-07 DIAGNOSIS — R627 Adult failure to thrive: Secondary | ICD-10-CM | POA: Diagnosis not present

## 2023-07-07 DIAGNOSIS — Z66 Do not resuscitate: Secondary | ICD-10-CM | POA: Diagnosis not present

## 2023-07-07 DIAGNOSIS — R935 Abnormal findings on diagnostic imaging of other abdominal regions, including retroperitoneum: Secondary | ICD-10-CM | POA: Diagnosis not present

## 2023-07-07 DIAGNOSIS — E039 Hypothyroidism, unspecified: Secondary | ICD-10-CM | POA: Diagnosis not present

## 2023-07-07 DIAGNOSIS — Z933 Colostomy status: Secondary | ICD-10-CM | POA: Diagnosis not present

## 2023-07-07 DIAGNOSIS — Z90710 Acquired absence of both cervix and uterus: Secondary | ICD-10-CM | POA: Diagnosis not present

## 2023-07-07 DIAGNOSIS — R188 Other ascites: Secondary | ICD-10-CM | POA: Diagnosis not present

## 2023-07-07 DIAGNOSIS — D649 Anemia, unspecified: Secondary | ICD-10-CM | POA: Diagnosis not present

## 2023-07-07 DIAGNOSIS — R600 Localized edema: Secondary | ICD-10-CM | POA: Diagnosis not present

## 2023-07-07 DIAGNOSIS — Z86718 Personal history of other venous thrombosis and embolism: Secondary | ICD-10-CM | POA: Diagnosis not present

## 2023-07-07 DIAGNOSIS — I10 Essential (primary) hypertension: Secondary | ICD-10-CM | POA: Diagnosis not present

## 2023-07-07 DIAGNOSIS — I700 Atherosclerosis of aorta: Secondary | ICD-10-CM | POA: Diagnosis not present

## 2023-07-07 DIAGNOSIS — N12 Tubulo-interstitial nephritis, not specified as acute or chronic: Secondary | ICD-10-CM | POA: Diagnosis not present

## 2023-07-07 DIAGNOSIS — J90 Pleural effusion, not elsewhere classified: Secondary | ICD-10-CM | POA: Diagnosis not present

## 2023-07-07 DIAGNOSIS — J939 Pneumothorax, unspecified: Secondary | ICD-10-CM | POA: Diagnosis not present

## 2023-07-07 DIAGNOSIS — K632 Fistula of intestine: Secondary | ICD-10-CM | POA: Diagnosis not present

## 2023-07-07 DIAGNOSIS — R1311 Dysphagia, oral phase: Secondary | ICD-10-CM | POA: Diagnosis not present

## 2023-07-07 DIAGNOSIS — Z515 Encounter for palliative care: Secondary | ICD-10-CM | POA: Diagnosis not present

## 2023-07-07 DIAGNOSIS — R1901 Right upper quadrant abdominal swelling, mass and lump: Secondary | ICD-10-CM | POA: Diagnosis not present

## 2023-07-07 DIAGNOSIS — Z4682 Encounter for fitting and adjustment of non-vascular catheter: Secondary | ICD-10-CM | POA: Diagnosis not present

## 2023-07-07 DIAGNOSIS — R2689 Other abnormalities of gait and mobility: Secondary | ICD-10-CM | POA: Diagnosis not present

## 2023-07-07 DIAGNOSIS — D72829 Elevated white blood cell count, unspecified: Secondary | ICD-10-CM | POA: Diagnosis not present

## 2023-07-07 DIAGNOSIS — N3289 Other specified disorders of bladder: Secondary | ICD-10-CM | POA: Diagnosis not present

## 2023-07-07 DIAGNOSIS — K219 Gastro-esophageal reflux disease without esophagitis: Secondary | ICD-10-CM | POA: Diagnosis not present

## 2023-07-07 DIAGNOSIS — J948 Other specified pleural conditions: Secondary | ICD-10-CM | POA: Diagnosis not present

## 2023-07-07 DIAGNOSIS — R609 Edema, unspecified: Secondary | ICD-10-CM | POA: Diagnosis not present

## 2023-07-07 DIAGNOSIS — L02211 Cutaneous abscess of abdominal wall: Secondary | ICD-10-CM | POA: Diagnosis not present

## 2023-07-07 DIAGNOSIS — E440 Moderate protein-calorie malnutrition: Secondary | ICD-10-CM | POA: Diagnosis not present

## 2023-07-07 DIAGNOSIS — E785 Hyperlipidemia, unspecified: Secondary | ICD-10-CM | POA: Diagnosis not present

## 2023-07-07 DIAGNOSIS — R110 Nausea: Secondary | ICD-10-CM | POA: Diagnosis not present

## 2023-07-07 DIAGNOSIS — K750 Abscess of liver: Secondary | ICD-10-CM | POA: Diagnosis not present

## 2023-07-07 DIAGNOSIS — B379 Candidiasis, unspecified: Secondary | ICD-10-CM | POA: Diagnosis not present

## 2023-07-07 DIAGNOSIS — I824Y9 Acute embolism and thrombosis of unspecified deep veins of unspecified proximal lower extremity: Secondary | ICD-10-CM | POA: Diagnosis not present

## 2023-07-07 DIAGNOSIS — I82621 Acute embolism and thrombosis of deep veins of right upper extremity: Secondary | ICD-10-CM | POA: Diagnosis not present

## 2023-07-07 DIAGNOSIS — K529 Noninfective gastroenteritis and colitis, unspecified: Secondary | ICD-10-CM | POA: Diagnosis not present

## 2023-07-07 DIAGNOSIS — Z434 Encounter for attention to other artificial openings of digestive tract: Secondary | ICD-10-CM | POA: Diagnosis not present

## 2023-07-07 DIAGNOSIS — E876 Hypokalemia: Secondary | ICD-10-CM | POA: Diagnosis not present

## 2023-07-07 DIAGNOSIS — R918 Other nonspecific abnormal finding of lung field: Secondary | ICD-10-CM | POA: Diagnosis not present

## 2023-07-07 DIAGNOSIS — R509 Fever, unspecified: Secondary | ICD-10-CM | POA: Diagnosis not present

## 2023-07-07 DIAGNOSIS — R52 Pain, unspecified: Secondary | ICD-10-CM | POA: Diagnosis not present

## 2023-07-07 DIAGNOSIS — I82463 Acute embolism and thrombosis of calf muscular vein, bilateral: Secondary | ICD-10-CM | POA: Diagnosis not present

## 2023-07-07 DIAGNOSIS — E539 Vitamin B deficiency, unspecified: Secondary | ICD-10-CM | POA: Diagnosis not present

## 2023-07-07 DIAGNOSIS — S52022D Displaced fracture of olecranon process without intraarticular extension of left ulna, subsequent encounter for closed fracture with routine healing: Secondary | ICD-10-CM | POA: Diagnosis not present

## 2023-07-07 DIAGNOSIS — J9811 Atelectasis: Secondary | ICD-10-CM | POA: Diagnosis not present

## 2023-07-07 DIAGNOSIS — K828 Other specified diseases of gallbladder: Secondary | ICD-10-CM | POA: Diagnosis not present

## 2023-07-07 DIAGNOSIS — K651 Peritoneal abscess: Secondary | ICD-10-CM | POA: Diagnosis not present

## 2023-07-08 DIAGNOSIS — E039 Hypothyroidism, unspecified: Secondary | ICD-10-CM | POA: Diagnosis not present

## 2023-07-08 DIAGNOSIS — J90 Pleural effusion, not elsewhere classified: Secondary | ICD-10-CM | POA: Diagnosis not present

## 2023-07-08 DIAGNOSIS — R1901 Right upper quadrant abdominal swelling, mass and lump: Secondary | ICD-10-CM | POA: Diagnosis not present

## 2023-07-08 DIAGNOSIS — N3289 Other specified disorders of bladder: Secondary | ICD-10-CM | POA: Diagnosis not present

## 2023-07-08 DIAGNOSIS — R609 Edema, unspecified: Secondary | ICD-10-CM | POA: Diagnosis not present

## 2023-07-08 DIAGNOSIS — Z90710 Acquired absence of both cervix and uterus: Secondary | ICD-10-CM | POA: Diagnosis not present

## 2023-07-08 DIAGNOSIS — R509 Fever, unspecified: Secondary | ICD-10-CM | POA: Diagnosis not present

## 2023-07-08 DIAGNOSIS — J9811 Atelectasis: Secondary | ICD-10-CM | POA: Diagnosis not present

## 2023-07-09 DIAGNOSIS — E039 Hypothyroidism, unspecified: Secondary | ICD-10-CM | POA: Diagnosis not present

## 2023-07-09 DIAGNOSIS — R1901 Right upper quadrant abdominal swelling, mass and lump: Secondary | ICD-10-CM | POA: Diagnosis not present

## 2023-07-10 DIAGNOSIS — E876 Hypokalemia: Secondary | ICD-10-CM | POA: Diagnosis not present

## 2023-07-10 DIAGNOSIS — E039 Hypothyroidism, unspecified: Secondary | ICD-10-CM | POA: Diagnosis not present

## 2023-07-10 DIAGNOSIS — R188 Other ascites: Secondary | ICD-10-CM | POA: Diagnosis not present

## 2023-07-10 DIAGNOSIS — R1901 Right upper quadrant abdominal swelling, mass and lump: Secondary | ICD-10-CM | POA: Diagnosis not present

## 2023-07-11 DIAGNOSIS — E039 Hypothyroidism, unspecified: Secondary | ICD-10-CM | POA: Diagnosis not present

## 2023-07-11 DIAGNOSIS — D72829 Elevated white blood cell count, unspecified: Secondary | ICD-10-CM | POA: Diagnosis not present

## 2023-07-11 DIAGNOSIS — K651 Peritoneal abscess: Secondary | ICD-10-CM | POA: Diagnosis not present

## 2023-07-11 DIAGNOSIS — B379 Candidiasis, unspecified: Secondary | ICD-10-CM | POA: Diagnosis not present

## 2023-07-11 DIAGNOSIS — Z933 Colostomy status: Secondary | ICD-10-CM | POA: Diagnosis not present

## 2023-07-12 DIAGNOSIS — D72829 Elevated white blood cell count, unspecified: Secondary | ICD-10-CM | POA: Diagnosis not present

## 2023-07-12 DIAGNOSIS — K651 Peritoneal abscess: Secondary | ICD-10-CM | POA: Diagnosis not present

## 2023-07-12 DIAGNOSIS — Z933 Colostomy status: Secondary | ICD-10-CM | POA: Diagnosis not present

## 2023-07-12 DIAGNOSIS — B379 Candidiasis, unspecified: Secondary | ICD-10-CM | POA: Diagnosis not present

## 2023-07-12 DIAGNOSIS — E039 Hypothyroidism, unspecified: Secondary | ICD-10-CM | POA: Diagnosis not present

## 2023-07-12 DIAGNOSIS — E876 Hypokalemia: Secondary | ICD-10-CM | POA: Diagnosis not present

## 2023-07-13 DIAGNOSIS — K651 Peritoneal abscess: Secondary | ICD-10-CM | POA: Diagnosis not present

## 2023-07-13 DIAGNOSIS — B379 Candidiasis, unspecified: Secondary | ICD-10-CM | POA: Diagnosis not present

## 2023-07-13 DIAGNOSIS — Z933 Colostomy status: Secondary | ICD-10-CM | POA: Diagnosis not present

## 2023-07-13 DIAGNOSIS — D72829 Elevated white blood cell count, unspecified: Secondary | ICD-10-CM | POA: Diagnosis not present

## 2023-07-13 DIAGNOSIS — E039 Hypothyroidism, unspecified: Secondary | ICD-10-CM | POA: Diagnosis not present

## 2023-07-14 DIAGNOSIS — K651 Peritoneal abscess: Secondary | ICD-10-CM | POA: Diagnosis not present

## 2023-07-14 DIAGNOSIS — B3789 Other sites of candidiasis: Secondary | ICD-10-CM | POA: Diagnosis not present

## 2023-07-14 DIAGNOSIS — K750 Abscess of liver: Secondary | ICD-10-CM | POA: Diagnosis not present

## 2023-07-14 DIAGNOSIS — E039 Hypothyroidism, unspecified: Secondary | ICD-10-CM | POA: Diagnosis not present

## 2023-07-15 DIAGNOSIS — R188 Other ascites: Secondary | ICD-10-CM | POA: Diagnosis not present

## 2023-07-15 DIAGNOSIS — E039 Hypothyroidism, unspecified: Secondary | ICD-10-CM | POA: Diagnosis not present

## 2023-07-15 DIAGNOSIS — K651 Peritoneal abscess: Secondary | ICD-10-CM | POA: Diagnosis not present

## 2023-07-15 DIAGNOSIS — D72829 Elevated white blood cell count, unspecified: Secondary | ICD-10-CM | POA: Diagnosis not present

## 2023-07-15 DIAGNOSIS — B3789 Other sites of candidiasis: Secondary | ICD-10-CM | POA: Diagnosis not present

## 2023-07-15 DIAGNOSIS — K750 Abscess of liver: Secondary | ICD-10-CM | POA: Diagnosis not present

## 2023-07-16 DIAGNOSIS — E039 Hypothyroidism, unspecified: Secondary | ICD-10-CM | POA: Diagnosis not present

## 2023-07-16 DIAGNOSIS — K651 Peritoneal abscess: Secondary | ICD-10-CM | POA: Diagnosis not present

## 2023-07-17 DIAGNOSIS — E039 Hypothyroidism, unspecified: Secondary | ICD-10-CM | POA: Diagnosis not present

## 2023-07-17 DIAGNOSIS — E876 Hypokalemia: Secondary | ICD-10-CM | POA: Diagnosis not present

## 2023-07-17 DIAGNOSIS — B3789 Other sites of candidiasis: Secondary | ICD-10-CM | POA: Diagnosis not present

## 2023-07-17 DIAGNOSIS — K651 Peritoneal abscess: Secondary | ICD-10-CM | POA: Diagnosis not present

## 2023-07-17 DIAGNOSIS — K529 Noninfective gastroenteritis and colitis, unspecified: Secondary | ICD-10-CM | POA: Diagnosis not present

## 2023-07-17 DIAGNOSIS — K750 Abscess of liver: Secondary | ICD-10-CM | POA: Diagnosis not present

## 2023-07-18 DIAGNOSIS — Z86718 Personal history of other venous thrombosis and embolism: Secondary | ICD-10-CM | POA: Diagnosis not present

## 2023-07-18 DIAGNOSIS — E039 Hypothyroidism, unspecified: Secondary | ICD-10-CM | POA: Diagnosis not present

## 2023-07-18 DIAGNOSIS — Z434 Encounter for attention to other artificial openings of digestive tract: Secondary | ICD-10-CM | POA: Diagnosis not present

## 2023-07-18 DIAGNOSIS — K651 Peritoneal abscess: Secondary | ICD-10-CM | POA: Diagnosis not present

## 2023-07-18 DIAGNOSIS — K750 Abscess of liver: Secondary | ICD-10-CM | POA: Diagnosis not present

## 2023-07-18 DIAGNOSIS — D72829 Elevated white blood cell count, unspecified: Secondary | ICD-10-CM | POA: Diagnosis not present

## 2023-07-19 DIAGNOSIS — K750 Abscess of liver: Secondary | ICD-10-CM | POA: Diagnosis not present

## 2023-07-19 DIAGNOSIS — D72829 Elevated white blood cell count, unspecified: Secondary | ICD-10-CM | POA: Diagnosis not present

## 2023-07-19 DIAGNOSIS — K651 Peritoneal abscess: Secondary | ICD-10-CM | POA: Diagnosis not present

## 2023-07-19 DIAGNOSIS — E039 Hypothyroidism, unspecified: Secondary | ICD-10-CM | POA: Diagnosis not present

## 2023-07-20 DIAGNOSIS — K651 Peritoneal abscess: Secondary | ICD-10-CM | POA: Diagnosis not present

## 2023-07-20 DIAGNOSIS — D72829 Elevated white blood cell count, unspecified: Secondary | ICD-10-CM | POA: Diagnosis not present

## 2023-07-20 DIAGNOSIS — E039 Hypothyroidism, unspecified: Secondary | ICD-10-CM | POA: Diagnosis not present

## 2023-07-20 DIAGNOSIS — K750 Abscess of liver: Secondary | ICD-10-CM | POA: Diagnosis not present

## 2023-07-20 DIAGNOSIS — B3789 Other sites of candidiasis: Secondary | ICD-10-CM | POA: Diagnosis not present

## 2023-07-21 DIAGNOSIS — E876 Hypokalemia: Secondary | ICD-10-CM | POA: Diagnosis not present

## 2023-07-21 DIAGNOSIS — K651 Peritoneal abscess: Secondary | ICD-10-CM | POA: Diagnosis not present

## 2023-07-21 DIAGNOSIS — Z86718 Personal history of other venous thrombosis and embolism: Secondary | ICD-10-CM | POA: Diagnosis not present

## 2023-07-21 DIAGNOSIS — J90 Pleural effusion, not elsewhere classified: Secondary | ICD-10-CM | POA: Diagnosis not present

## 2023-07-21 DIAGNOSIS — I82463 Acute embolism and thrombosis of calf muscular vein, bilateral: Secondary | ICD-10-CM | POA: Diagnosis not present

## 2023-07-21 DIAGNOSIS — J9811 Atelectasis: Secondary | ICD-10-CM | POA: Diagnosis not present

## 2023-07-21 DIAGNOSIS — D72829 Elevated white blood cell count, unspecified: Secondary | ICD-10-CM | POA: Diagnosis not present

## 2023-07-21 DIAGNOSIS — R935 Abnormal findings on diagnostic imaging of other abdominal regions, including retroperitoneum: Secondary | ICD-10-CM | POA: Diagnosis not present

## 2023-07-21 DIAGNOSIS — R110 Nausea: Secondary | ICD-10-CM | POA: Diagnosis not present

## 2023-07-21 DIAGNOSIS — R600 Localized edema: Secondary | ICD-10-CM | POA: Diagnosis not present

## 2023-07-22 DIAGNOSIS — J90 Pleural effusion, not elsewhere classified: Secondary | ICD-10-CM | POA: Diagnosis not present

## 2023-07-22 DIAGNOSIS — E876 Hypokalemia: Secondary | ICD-10-CM | POA: Diagnosis not present

## 2023-07-22 DIAGNOSIS — K651 Peritoneal abscess: Secondary | ICD-10-CM | POA: Diagnosis not present

## 2023-07-22 DIAGNOSIS — J9811 Atelectasis: Secondary | ICD-10-CM | POA: Diagnosis not present

## 2023-07-23 DIAGNOSIS — K651 Peritoneal abscess: Secondary | ICD-10-CM | POA: Diagnosis not present

## 2023-07-23 DIAGNOSIS — J9811 Atelectasis: Secondary | ICD-10-CM | POA: Diagnosis not present

## 2023-07-23 DIAGNOSIS — J90 Pleural effusion, not elsewhere classified: Secondary | ICD-10-CM | POA: Diagnosis not present

## 2023-07-24 DIAGNOSIS — K632 Fistula of intestine: Secondary | ICD-10-CM | POA: Diagnosis not present

## 2023-07-24 DIAGNOSIS — J9811 Atelectasis: Secondary | ICD-10-CM | POA: Diagnosis not present

## 2023-07-24 DIAGNOSIS — Z4682 Encounter for fitting and adjustment of non-vascular catheter: Secondary | ICD-10-CM | POA: Diagnosis not present

## 2023-07-24 DIAGNOSIS — J90 Pleural effusion, not elsewhere classified: Secondary | ICD-10-CM | POA: Diagnosis not present

## 2023-07-24 DIAGNOSIS — J948 Other specified pleural conditions: Secondary | ICD-10-CM | POA: Diagnosis not present

## 2023-07-24 DIAGNOSIS — E039 Hypothyroidism, unspecified: Secondary | ICD-10-CM | POA: Diagnosis not present

## 2023-07-25 DIAGNOSIS — I82621 Acute embolism and thrombosis of deep veins of right upper extremity: Secondary | ICD-10-CM | POA: Diagnosis not present

## 2023-07-25 DIAGNOSIS — K651 Peritoneal abscess: Secondary | ICD-10-CM | POA: Diagnosis not present

## 2023-07-25 DIAGNOSIS — E039 Hypothyroidism, unspecified: Secondary | ICD-10-CM | POA: Diagnosis not present

## 2023-07-25 DIAGNOSIS — J939 Pneumothorax, unspecified: Secondary | ICD-10-CM | POA: Diagnosis not present

## 2023-07-25 DIAGNOSIS — R918 Other nonspecific abnormal finding of lung field: Secondary | ICD-10-CM | POA: Diagnosis not present

## 2023-07-25 DIAGNOSIS — J90 Pleural effusion, not elsewhere classified: Secondary | ICD-10-CM | POA: Diagnosis not present

## 2023-07-26 DIAGNOSIS — K651 Peritoneal abscess: Secondary | ICD-10-CM | POA: Diagnosis not present

## 2023-07-26 DIAGNOSIS — J9811 Atelectasis: Secondary | ICD-10-CM | POA: Diagnosis not present

## 2023-07-26 DIAGNOSIS — I82621 Acute embolism and thrombosis of deep veins of right upper extremity: Secondary | ICD-10-CM | POA: Diagnosis not present

## 2023-07-26 DIAGNOSIS — R918 Other nonspecific abnormal finding of lung field: Secondary | ICD-10-CM | POA: Diagnosis not present

## 2023-07-26 DIAGNOSIS — J90 Pleural effusion, not elsewhere classified: Secondary | ICD-10-CM | POA: Diagnosis not present

## 2023-07-26 DIAGNOSIS — E039 Hypothyroidism, unspecified: Secondary | ICD-10-CM | POA: Diagnosis not present

## 2023-07-27 DIAGNOSIS — K651 Peritoneal abscess: Secondary | ICD-10-CM | POA: Diagnosis not present

## 2023-07-27 DIAGNOSIS — I82621 Acute embolism and thrombosis of deep veins of right upper extremity: Secondary | ICD-10-CM | POA: Diagnosis not present

## 2023-07-27 DIAGNOSIS — J90 Pleural effusion, not elsewhere classified: Secondary | ICD-10-CM | POA: Diagnosis not present

## 2023-07-27 DIAGNOSIS — J9811 Atelectasis: Secondary | ICD-10-CM | POA: Diagnosis not present

## 2023-07-27 DIAGNOSIS — E039 Hypothyroidism, unspecified: Secondary | ICD-10-CM | POA: Diagnosis not present

## 2023-07-28 DIAGNOSIS — K651 Peritoneal abscess: Secondary | ICD-10-CM | POA: Diagnosis not present

## 2023-07-28 DIAGNOSIS — J90 Pleural effusion, not elsewhere classified: Secondary | ICD-10-CM | POA: Diagnosis not present

## 2023-07-28 DIAGNOSIS — E039 Hypothyroidism, unspecified: Secondary | ICD-10-CM | POA: Diagnosis not present

## 2023-07-28 DIAGNOSIS — J9811 Atelectasis: Secondary | ICD-10-CM | POA: Diagnosis not present

## 2023-07-28 DIAGNOSIS — Z4682 Encounter for fitting and adjustment of non-vascular catheter: Secondary | ICD-10-CM | POA: Diagnosis not present

## 2023-07-28 DIAGNOSIS — I82621 Acute embolism and thrombosis of deep veins of right upper extremity: Secondary | ICD-10-CM | POA: Diagnosis not present

## 2023-07-29 DIAGNOSIS — E039 Hypothyroidism, unspecified: Secondary | ICD-10-CM | POA: Diagnosis not present

## 2023-07-29 DIAGNOSIS — K651 Peritoneal abscess: Secondary | ICD-10-CM | POA: Diagnosis not present

## 2023-07-29 DIAGNOSIS — J90 Pleural effusion, not elsewhere classified: Secondary | ICD-10-CM | POA: Diagnosis not present

## 2023-07-29 DIAGNOSIS — I82621 Acute embolism and thrombosis of deep veins of right upper extremity: Secondary | ICD-10-CM | POA: Diagnosis not present

## 2023-07-30 DIAGNOSIS — K651 Peritoneal abscess: Secondary | ICD-10-CM | POA: Diagnosis not present

## 2023-07-30 DIAGNOSIS — R110 Nausea: Secondary | ICD-10-CM | POA: Diagnosis not present

## 2023-07-30 DIAGNOSIS — J90 Pleural effusion, not elsewhere classified: Secondary | ICD-10-CM | POA: Diagnosis not present

## 2023-07-30 DIAGNOSIS — E039 Hypothyroidism, unspecified: Secondary | ICD-10-CM | POA: Diagnosis not present

## 2023-07-31 DIAGNOSIS — R9389 Abnormal findings on diagnostic imaging of other specified body structures: Secondary | ICD-10-CM | POA: Diagnosis not present

## 2023-07-31 DIAGNOSIS — K651 Peritoneal abscess: Secondary | ICD-10-CM | POA: Diagnosis not present

## 2023-07-31 DIAGNOSIS — J90 Pleural effusion, not elsewhere classified: Secondary | ICD-10-CM | POA: Diagnosis not present

## 2023-07-31 DIAGNOSIS — Z7401 Bed confinement status: Secondary | ICD-10-CM | POA: Diagnosis not present

## 2023-07-31 DIAGNOSIS — R627 Adult failure to thrive: Secondary | ICD-10-CM | POA: Diagnosis not present

## 2023-07-31 DIAGNOSIS — R531 Weakness: Secondary | ICD-10-CM | POA: Diagnosis not present

## 2023-07-31 DIAGNOSIS — M6250 Muscle wasting and atrophy, not elsewhere classified, unspecified site: Secondary | ICD-10-CM | POA: Diagnosis not present

## 2023-07-31 DIAGNOSIS — R110 Nausea: Secondary | ICD-10-CM | POA: Diagnosis not present

## 2023-07-31 DIAGNOSIS — Z515 Encounter for palliative care: Secondary | ICD-10-CM | POA: Diagnosis not present

## 2023-09-05 DEATH — deceased
